# Patient Record
Sex: Female | Born: 1960 | Race: White | Hispanic: No | Smoking: Current every day smoker
Health system: Southern US, Community
[De-identification: ages and names within clinical notes are randomized; demographics above are authoritative.]

## PROBLEM LIST (undated history)

## (undated) DIAGNOSIS — N200 Calculus of kidney: Secondary | ICD-10-CM

## (undated) DIAGNOSIS — K589 Irritable bowel syndrome without diarrhea: Secondary | ICD-10-CM

## (undated) HISTORY — PX: ABDOMINAL HYSTERECTOMY: SHX81

## (undated) HISTORY — PX: CHOLECYSTECTOMY: SHX55

## (undated) HISTORY — PX: EYE SURGERY: SHX253

---

## 2011-05-27 ENCOUNTER — Emergency Department (HOSPITAL_COMMUNITY)
Admission: EM | Admit: 2011-05-27 | Discharge: 2011-05-27 | Disposition: A | Discharge status: Home / Self Care | Payer: Medicaid Other | Attending: Emergency Medicine | Admitting: Emergency Medicine

## 2011-05-27 ENCOUNTER — Encounter: Payer: Self-pay | Admitting: *Deleted

## 2011-05-27 ENCOUNTER — Emergency Department (HOSPITAL_COMMUNITY): Payer: Medicaid Other

## 2011-05-27 DIAGNOSIS — G43909 Migraine, unspecified, not intractable, without status migrainosus: Secondary | ICD-10-CM | POA: Insufficient documentation

## 2011-05-27 DIAGNOSIS — M545 Low back pain, unspecified: Secondary | ICD-10-CM | POA: Insufficient documentation

## 2011-05-27 DIAGNOSIS — F17200 Nicotine dependence, unspecified, uncomplicated: Secondary | ICD-10-CM | POA: Insufficient documentation

## 2011-05-27 MED ORDER — MORPHINE SULFATE 4 MG/ML IJ SOLN
4.0000 mg | Freq: Once | INTRAMUSCULAR | Status: AC
Start: 2011-05-27 — End: 2011-05-27
  Administered 2011-05-27: 4 mg via INTRAVENOUS
  Filled 2011-05-27: qty 1

## 2011-05-27 MED ORDER — ONDANSETRON HCL 4 MG/2ML IJ SOLN
4.0000 mg | Freq: Once | INTRAMUSCULAR | Status: AC
  Administered 2011-05-27: 4 mg via INTRAVENOUS
  Filled 2011-05-27: qty 2

## 2011-05-27 MED ORDER — HYDROCODONE-ACETAMINOPHEN 5-325 MG PO TABS: 1.0000 | ORAL_TABLET | ORAL | Status: AC | PRN

## 2011-05-27 MED ORDER — MORPHINE SULFATE 4 MG/ML IJ SOLN
4.0000 mg | Freq: Once | INTRAMUSCULAR | Status: AC
  Administered 2011-05-27: 4 mg via INTRAVENOUS
  Filled 2011-05-27: qty 1

## 2011-05-27 MED ORDER — DEXAMETHASONE SODIUM PHOSPHATE 4 MG/ML IJ SOLN
10.0000 mg | Freq: Once | INTRAMUSCULAR | Status: AC
  Administered 2011-05-27: 10 mg via INTRAVENOUS
  Filled 2011-05-27: qty 2
  Filled 2011-05-27: qty 1

## 2011-05-27 MED ORDER — CYCLOBENZAPRINE HCL 10 MG PO TABS: 10.0000 mg | ORAL_TABLET | Freq: Two times a day (BID) | ORAL | Status: AC | PRN

## 2011-05-27 NOTE — ED Notes (Signed)
Pt states she fell over her dog x 5 days ago, denies hitting head/loc.  States she felt she pulled muscle in lower back.  C/o lower back pain, stiff neck, weak in legs, migraine x 4 days with n/v, light/sound sensitivity.

## 2011-05-27 NOTE — ED Provider Notes (Addendum)
History     Chief Complaint  Patient presents with  . Back Pain  . Migraine   Patient is a 50 y.o. female presenting with back pain and migraine. The history is provided by the patient.  Back Pain  This is a new problem. The current episode started more than 2 days ago. The problem occurs constantly. The problem has been gradually worsening. The pain is associated with falling. The pain is present in the lumbar spine and gluteal region. The quality of the pain is described as aching and stabbing. The pain is moderate. The symptoms are aggravated by certain positions. Stiffness is present all day. Associated symptoms include headaches. Pertinent negatives include no fever, no dysuria and no weakness.  Migraine This is a recurrent problem. The current episode started in the past 7 days. The problem occurs constantly. The problem has been gradually worsening. Associated symptoms include headaches, nausea and vomiting. Pertinent negatives include no fever or weakness. Exacerbated by: Headache worse with light/sound. Treatments tried: No better with Exedrin Migraine.  Migraine This is a recurrent problem. The current episode started in the past 7 days. The problem occurs constantly. The problem has been gradually worsening. Associated symptoms include headaches. Exacerbated by: Headache worse with light/sound. Treatments tried: No better with Exedrin Migraine.  She tripped, falling over her dog 4 days ago injuring her back. She complains of low back and bilateral hip pain since fall that continues to worsen to where now she feels stiff all over  Past Medical History  Diagnosis Date  . Migraine     Past Surgical History  Procedure Date  . Abdominal hysterectomy   . Cholecystectomy     History reviewed. No pertinent family history.  History  Substance Use Topics  . Smoking status: Current Everyday Smoker  . Smokeless tobacco: Not on file  . Alcohol Use: No    OB History    Grav Para  Term Preterm Abortions TAB SAB Ect Mult Living                  Review of Systems  Constitutional: Negative.  Negative for fever.  Eyes: Positive for photophobia.  Respiratory: Negative.   Cardiovascular: Negative.   Gastrointestinal: Positive for nausea and vomiting.  Genitourinary: Negative.  Negative for dysuria.  Musculoskeletal: Positive for back pain.  Neurological: Positive for headaches. Negative for dizziness, facial asymmetry, speech difficulty and weakness.    Physical Exam  BP 131/87  Pulse 87  Temp(Src) 98.1 F (36.7 C) (Oral)  Resp 18  Ht 5\' 6"  (1.676 m)  Wt 110 lb (49.896 kg)  BMI 17.75 kg/m2  SpO2 100%  Physical Exam  Constitutional: She appears well-developed and well-nourished.  HENT:  Head: Normocephalic and atraumatic.  Eyes: EOM are normal. Pupils are equal, round, and reactive to light.  Neck: Normal range of motion. Neck supple.  Pulmonary/Chest: Effort normal.  Abdominal: Soft. Bowel sounds are normal.  Musculoskeletal:       Right hip: She exhibits no tenderness.       Left hip: She exhibits no tenderness.       Lumbar back: She exhibits tenderness and pain. She exhibits no swelling.    ED Course  Procedures  MDM  Medical screening examination/treatment/procedure(s) were conducted as a shared visit with non-physician practitioner(s) and myself.  I personally evaluated the patient during the encounter.   See my noe above    Chea Malan    Rodena Medin, PA 05/27/11 1303  [name],  MD 05/27/11 1455  [name], MD 06/25/11 [phone]

## 2011-10-27 ENCOUNTER — Encounter (HOSPITAL_COMMUNITY): Payer: Self-pay | Admitting: *Deleted

## 2011-10-27 ENCOUNTER — Emergency Department (HOSPITAL_COMMUNITY)
Admission: EM | Admit: 2011-10-27 | Discharge: 2011-10-27 | Disposition: A | Discharge status: Home / Self Care | Payer: Medicaid Other | Attending: Emergency Medicine | Admitting: Emergency Medicine

## 2011-10-27 DIAGNOSIS — R109 Unspecified abdominal pain: Secondary | ICD-10-CM

## 2011-10-27 DIAGNOSIS — R112 Nausea with vomiting, unspecified: Secondary | ICD-10-CM | POA: Insufficient documentation

## 2011-10-27 DIAGNOSIS — R1013 Epigastric pain: Secondary | ICD-10-CM | POA: Insufficient documentation

## 2011-10-27 DIAGNOSIS — Z9079 Acquired absence of other genital organ(s): Secondary | ICD-10-CM | POA: Insufficient documentation

## 2011-10-27 DIAGNOSIS — F17200 Nicotine dependence, unspecified, uncomplicated: Secondary | ICD-10-CM | POA: Insufficient documentation

## 2011-10-27 DIAGNOSIS — Z87442 Personal history of urinary calculi: Secondary | ICD-10-CM | POA: Insufficient documentation

## 2011-10-27 HISTORY — DX: Calculus of kidney: N20.0

## 2011-10-27 LAB — BASIC METABOLIC PANEL
CO2: 23 mEq/L (ref 19–32)
Calcium: 9.9 mg/dL (ref 8.4–10.5)
Creatinine, Ser: 0.66 mg/dL (ref 0.50–1.10)
GFR calc non Af Amer: >90 mL/min (ref >90)
Glucose, Bld: 94 mg/dL (ref 70–99)
Sodium: 141 mEq/L (ref 135–145)

## 2011-10-27 LAB — CBC
MCH: 29.6 pg (ref 26.0–34.0)
MCV: 90.4 fL (ref 78.0–100.0)
Platelets: 137 10*3/uL — ABNORMAL LOW (ref 150–400)
RBC: 4.77 MIL/uL (ref 3.87–5.11)
RDW: 13.4 % (ref 11.5–15.5)
WBC: 7 10*3/uL (ref 4.0–10.5)

## 2011-10-27 LAB — DIFFERENTIAL
Basophils Absolute: 0 10*3/uL (ref 0.0–0.1)
Eosinophils Absolute: 0 10*3/uL (ref 0.0–0.7)
Eosinophils Relative: 0 % (ref 0–5)
Lymphs Abs: 1.6 10*3/uL (ref 0.7–4.0)
Neutrophils Relative %: 72 % (ref 43–77)

## 2011-10-27 MED ORDER — ONDANSETRON HCL 4 MG/2ML IJ SOLN
4.0000 mg | Freq: Once | INTRAMUSCULAR | Status: AC
  Administered 2011-10-27: 4 mg via INTRAVENOUS
  Filled 2011-10-27: qty 2

## 2011-10-27 MED ORDER — HYDROMORPHONE HCL PF 1 MG/ML IJ SOLN
1.0000 mg | Freq: Once | INTRAMUSCULAR | Status: AC
  Administered 2011-10-27: 1 mg via INTRAVENOUS
  Filled 2011-10-27: qty 1

## 2011-10-27 MED ORDER — OXYCODONE-ACETAMINOPHEN 5-325 MG PO TABS: 1.0000 | ORAL_TABLET | Freq: Four times a day (QID) | ORAL | Status: AC | PRN

## 2011-10-27 MED ORDER — HYDROMORPHONE HCL PF 1 MG/ML IJ SOLN
0.5000 mg | Freq: Once | INTRAMUSCULAR | Status: AC
  Administered 2011-10-27: 0.5 mg via INTRAVENOUS
  Filled 2011-10-27: qty 1

## 2011-10-27 MED ORDER — PANTOPRAZOLE SODIUM 40 MG IV SOLR
40.0000 mg | Freq: Once | INTRAVENOUS | Status: AC
  Administered 2011-10-27: 40 mg via INTRAVENOUS
  Filled 2011-10-27: qty 40

## 2011-10-27 NOTE — ED Provider Notes (Signed)
This chart was scribed for Joya Gaskins, MD by Wallis Mart. The patient was seen in room [facility]2/[facility] and the patient's care was started at 2:25 PM.   CSN: [id number] Arrival date & time: 10/27/2011  1:23 PM   First MD Initiated Contact with Patient 10/27/11 1346      Chief Complaint  Patient presents with  . Abdominal Pain  . Emesis     Carolyn Boyd is a 50 y.o. female who presents to the Emergency Department complaining of   progressively worsening, sudden onset abdominal pain in the epigastriac region that began several days ago. Pt states that the pain is constant but worsens when she eats.  Pt c/o associated emesis. Pt has normal BMs and has noticed some blood/dark spots in her stool.   Nothing improves the pain. Pt has h/o cholecystectomy and hysterectomy.   No blood in vomitus Pt reports she has had this pain on/off in the past and has GI referral later this month for workup No CP reported  Patient is a 50 y.o. female presenting with abdominal pain and vomiting. The history is provided by the patient.  Abdominal Pain The primary symptoms of the illness include abdominal pain and vomiting. The current episode started more than 2 days ago. The onset of the illness was sudden. The problem has been gradually worsening.  The abdominal pain began more than 2 days ago. The pain came on suddenly. The abdominal pain has been gradually worsening since its onset. The abdominal pain is located in the epigastric region. The abdominal pain is relieved by nothing. The abdominal pain is exacerbated by eating.  Emesis  Associated symptoms include abdominal pain.     Past Medical History  Diagnosis Date  . Migraine   . Kidney stones     Past Surgical History  Procedure Date  . Abdominal hysterectomy   . Cholecystectomy     History reviewed. No pertinent family history.  History  Substance Use Topics  . Smoking status: Current Everyday Smoker -- 0.5 packs/day    Types:  Cigarettes  . Smokeless tobacco: Not on file  . Alcohol Use: No    OB History    Grav Para Term Preterm Abortions TAB SAB Ect Mult Living                  Review of Systems  Gastrointestinal: Positive for vomiting and abdominal pain.  10 Systems reviewed and are negative for acute change except as noted in the HPI.   Allergies  Penicillins and Sulfur  Home Medications   Current Outpatient Rx  Name Route Sig Dispense Refill  . DICYCLOMINE HCL 10 MG PO CAPS Oral Take 10 mg by mouth 4 (four) times daily -  before meals and at bedtime.      Marland Kitchen ESOMEPRAZOLE MAGNESIUM 40 MG PO CPDR Oral Take 40 mg by mouth daily before breakfast.        BP 132/91  Pulse 87  Temp(Src) 98.1 F (36.7 C) (Oral)  Resp 22  Ht 5' 6.5" (1.689 m)  Wt 106 lb (48.081 kg)  BMI 16.85 kg/m2  SpO2 99%  Physical Exam CONSTITUTIONAL: Well developed/well nourished HEAD AND FACE: Normocephalic/atraumatic EYES: EOMI/PERRL, no scleral icterus ENMT: Mucous membranes moist NECK: supple no meningeal signs CV: S1/S2 noted, no murmurs/rubs/gallops noted LUNGS: Lungs are clear to auscultation bilaterally, no apparent distress ABDOMEN:  soft but diffuse tenderness, no rebound or guarding GU - no CVA tenderness NEURO: Pt is awake/alert, moves all  extremitiesx4 EXTREMITIES: pulses normal, full ROM SKIN: warm, color normal PSYCH: no abnormalities of mood noted  ED Course  Procedures  DIAGNOSTIC STUDIES: Oxygen Saturation is 99% on room air, normal by my interpretation.    COORDINATION OF CARE:    Labs Reviewed  CBC - Abnormal; Notable for the following:    Platelets 137 (*)    All other components within normal limits  DIFFERENTIAL  BASIC METABOLIC PANEL   7:84 PM Labs reviewed, pt improved, no vomiting, well appearing, reports she has had this pain on/off for awhile, doubt acute abd process  The patient appears reasonably screened and/or stabilized for discharge and I doubt any other medical  condition or other [facility] requiring further screening, evaluation, or treatment in the ED at this time prior to discharge.    MDM  Nursing notes reviewed and considered in documentation All labs/vitals reviewed and considered    I personally performed the services described in this documentation, which was scribed in my presence. The recorded information has been reviewed and considered.          Joya Gaskins, MD 10/27/11 [phone]

## 2011-10-27 NOTE — ED Notes (Signed)
Pt c/o abd pain and vomiting. Pt states she was scheduled to see a gastroenterologist on the 20th of this month but could not stand the pain.

## 2012-01-31 ENCOUNTER — Encounter (HOSPITAL_COMMUNITY): Payer: Self-pay | Admitting: Oncology

## 2012-01-31 ENCOUNTER — Emergency Department (HOSPITAL_COMMUNITY)
Admission: EM | Admit: 2012-01-31 | Discharge: 2012-01-31 | Disposition: A | Discharge status: Home / Self Care | Payer: Medicaid Other | Attending: Emergency Medicine | Admitting: Emergency Medicine

## 2012-01-31 DIAGNOSIS — K529 Noninfective gastroenteritis and colitis, unspecified: Secondary | ICD-10-CM

## 2012-01-31 DIAGNOSIS — K5289 Other specified noninfective gastroenteritis and colitis: Secondary | ICD-10-CM | POA: Insufficient documentation

## 2012-01-31 DIAGNOSIS — Z9089 Acquired absence of other organs: Secondary | ICD-10-CM | POA: Insufficient documentation

## 2012-01-31 DIAGNOSIS — Z90710 Acquired absence of both cervix and uterus: Secondary | ICD-10-CM | POA: Insufficient documentation

## 2012-01-31 DIAGNOSIS — F411 Generalized anxiety disorder: Secondary | ICD-10-CM | POA: Insufficient documentation

## 2012-01-31 DIAGNOSIS — R109 Unspecified abdominal pain: Secondary | ICD-10-CM | POA: Insufficient documentation

## 2012-01-31 DIAGNOSIS — R10819 Abdominal tenderness, unspecified site: Secondary | ICD-10-CM | POA: Insufficient documentation

## 2012-01-31 HISTORY — DX: Irritable bowel syndrome, unspecified: K58.9

## 2012-01-31 LAB — DIFFERENTIAL
Basophils Absolute: 0 10*3/uL (ref 0.0–0.1)
Lymphocytes Relative: 23 % (ref 12–46)
Lymphs Abs: 1.4 10*3/uL (ref 0.7–4.0)
Neutro Abs: 4.5 10*3/uL (ref 1.7–7.7)
Neutrophils Relative %: 73 % (ref 43–77)

## 2012-01-31 LAB — URINALYSIS, ROUTINE W REFLEX MICROSCOPIC
Glucose, UA: NEGATIVE mg/dL
Leukocytes, UA: NEGATIVE
Protein, ur: NEGATIVE mg/dL
Specific Gravity, Urine: 1.015 (ref 1.005–1.030)

## 2012-01-31 LAB — BASIC METABOLIC PANEL
CO2: 25 mEq/L (ref 19–32)
Calcium: 9.8 mg/dL (ref 8.4–10.5)
Chloride: 108 mEq/L (ref 96–112)
Glucose, Bld: 107 mg/dL — ABNORMAL HIGH (ref 70–99)
Potassium: 4.1 mEq/L (ref 3.5–5.1)
Sodium: 142 mEq/L (ref 135–145)

## 2012-01-31 LAB — URINE MICROSCOPIC-ADD ON

## 2012-01-31 LAB — POCT PREGNANCY, URINE: Preg Test, Ur: NEGATIVE

## 2012-01-31 LAB — CBC
Platelets: 143 10*3/uL — ABNORMAL LOW (ref 150–400)
RBC: 4.56 MIL/uL (ref 3.87–5.11)
RDW: 13.7 % (ref 11.5–15.5)
WBC: 6.2 10*3/uL (ref 4.0–10.5)

## 2012-01-31 MED ORDER — SODIUM CHLORIDE 0.9 % IV SOLN
Freq: Once | INTRAVENOUS | Status: AC
  Administered 2012-01-31: 1000 mL via INTRAVENOUS

## 2012-01-31 MED ORDER — LOPERAMIDE HCL 2 MG PO CAPS
4.0000 mg | ORAL_CAPSULE | Freq: Once | ORAL | Status: AC
  Administered 2012-01-31: 4 mg via ORAL
  Filled 2012-01-31: qty 2

## 2012-01-31 MED ORDER — HYDROMORPHONE HCL PF 1 MG/ML IJ SOLN
1.0000 mg | Freq: Once | INTRAMUSCULAR | Status: AC
  Administered 2012-01-31: 1 mg via INTRAVENOUS
  Filled 2012-01-31: qty 1

## 2012-01-31 MED ORDER — FENTANYL CITRATE 0.05 MG/ML IJ SOLN
50.0000 ug | INTRAMUSCULAR | Status: AC
  Administered 2012-01-31: 50 ug via INTRAVENOUS
  Filled 2012-01-31: qty 2

## 2012-01-31 MED ORDER — ONDANSETRON HCL 4 MG/2ML IJ SOLN
4.0000 mg | Freq: Once | INTRAMUSCULAR | Status: AC
  Administered 2012-01-31: 4 mg via INTRAVENOUS
  Filled 2012-01-31: qty 2

## 2012-01-31 MED ORDER — METOCLOPRAMIDE HCL 10 MG PO TABS: 10.0000 mg | ORAL_TABLET | Freq: Four times a day (QID) | ORAL | Status: DC | PRN

## 2012-01-31 MED ORDER — SODIUM CHLORIDE 0.9 % IV BOLUS (SEPSIS)
1000.0000 mL | Freq: Once | INTRAVENOUS | Status: AC
  Administered 2012-01-31: 1000 mL via INTRAVENOUS

## 2012-01-31 NOTE — Discharge Instructions (Signed)
Take Imodium AD as needed for diarrhea.  Diarrhea Infections caused by germs (bacterial) or a virus commonly cause diarrhea. Your caregiver has determined that with time, rest and fluids, the diarrhea should improve. In general, eat normally while drinking more water than usual. Although water may prevent dehydration, it does not contain salt and minerals (electrolytes). Broths, weak tea without caffeine and oral rehydration solutions (ORS) replace fluids and electrolytes. Small amounts of fluids should be taken frequently. Large amounts at one time may not be tolerated. Plain water may be harmful in infants and the elderly. Oral rehydrating solutions (ORS) are available at pharmacies and grocery stores. ORS replace water and important electrolytes in proper proportions. Sports drinks are not as effective as ORS and may be harmful due to sugars worsening diarrhea.  ORS is especially recommended for use in children with diarrhea. As a general guideline for children, replace any new fluid losses from diarrhea and/or vomiting with ORS as follows:   If your child weighs 22 pounds or under (10 kg or less), give 60-120 mL ( -  cup or 2 - 4 ounces) of ORS for each episode of diarrheal stool or vomiting episode.   If your child weighs more than 22 pounds (more than 10 kgs), give 120-240 mL ( - 1 cup or 4 - 8 ounces) of ORS for each diarrheal stool or episode of vomiting.   While correcting for dehydration, children should eat normally. However, foods high in sugar should be avoided because this may worsen diarrhea. Large amounts of carbonated soft drinks, juice, gelatin desserts and other highly sugared drinks should be avoided.   After correction of dehydration, other liquids that are appealing to the child may be added. Children should drink small amounts of fluids frequently and fluids should be increased as tolerated. Children should drink enough fluids to keep urine clear or pale yellow.   Adults  should eat normally while drinking more fluids than usual. Drink small amounts of fluids frequently and increase as tolerated. Drink enough fluids to keep urine clear or pale yellow. Broths, weak decaffeinated tea, lemon lime soft drinks (allowed to go flat) and ORS replace fluids and electrolytes.   Avoid:   Carbonated drinks.   Juice.   Extremely hot or cold fluids.   Caffeine drinks.   Fatty, greasy foods.   Alcohol.   Tobacco.   Too much intake of anything at one time.   Gelatin desserts.   Probiotics are active cultures of beneficial bacteria. They may lessen the amount and number of diarrheal stools in adults. Probiotics can be found in yogurt with active cultures and in supplements.   Wash hands well to avoid spreading bacteria and virus.   Anti-diarrheal medications are not recommended for infants and children.   Only take over-the-counter or prescription medicines for pain, discomfort or fever as directed by your caregiver. Do not give aspirin to children because it may cause Reye's Syndrome.   For adults, ask your caregiver if you should continue all prescribed and over-the-counter medicines.   If your caregiver has given you a follow-up appointment, it is very important to keep that appointment. Not keeping the appointment could result in a chronic or permanent injury, and disability. If there is any problem keeping the appointment, you must call back to this facility for assistance.  SEEK IMMEDIATE MEDICAL CARE IF:   You or your child is unable to keep fluids down or other symptoms or problems become worse in spite of treatment.  Vomiting or diarrhea develops and becomes persistent.   There is vomiting of blood or bile (green material).   There is blood in the stool or the stools are black and tarry.   There is no urine output in 6-8 hours or there is only a small amount of very dark urine.   Abdominal pain develops, increases or localizes.   You have a  fever.   Your baby is older than 3 months with a rectal temperature of 102 F (38.9 C) or higher.   Your baby is 56 months old or younger with a rectal temperature of 100.4 F (38 C) or higher.   You or your child develops excessive weakness, dizziness, fainting or extreme thirst.   You or your child develops a rash, stiff neck, severe headache or become irritable or sleepy and difficult to awaken.  MAKE SURE YOU:   Understand these instructions.   Will watch your condition.   Will get help right away if you are not doing well or get worse.  Document Released: 10/22/2002 Document Revised: 10/21/2011 Document Reviewed: 09/08/2009 [facility] Patient Information 2012 [place], [place].  Nausea and Vomiting Nausea is a sick feeling that often comes before throwing up (vomiting). Vomiting is a reflex where stomach contents come out of your mouth. Vomiting can cause severe loss of body fluids (dehydration). Children and elderly adults can become dehydrated quickly, especially if they also have diarrhea. Nausea and vomiting are symptoms of a condition or disease. It is important to find the cause of your symptoms. CAUSES   Direct irritation of the stomach lining. This irritation can result from increased acid production (gastroesophageal reflux disease), infection, food poisoning, taking certain medicines (such as nonsteroidal anti-inflammatory drugs), alcohol use, or tobacco use.   Signals from the brain.These signals could be caused by a headache, heat exposure, an inner ear disturbance, increased pressure in the brain from injury, infection, a tumor, or a concussion, pain, emotional stimulus, or metabolic problems.   An obstruction in the gastrointestinal tract (bowel obstruction).   Illnesses such as diabetes, hepatitis, gallbladder problems, appendicitis, kidney problems, cancer, sepsis, atypical symptoms of a heart attack, or eating disorders.   Medical treatments such as chemotherapy and  radiation.   Receiving medicine that makes you sleep (general anesthetic) during surgery.  DIAGNOSIS Your caregiver may ask for tests to be done if the problems do not improve after a few days. Tests may also be done if symptoms are severe or if the reason for the nausea and vomiting is not clear. Tests may include:  Urine tests.   Blood tests.   Stool tests.   Cultures (to look for evidence of infection).   X-rays or other imaging studies.  Test results can help your caregiver make decisions about treatment or the need for additional tests. TREATMENT You need to stay well hydrated. Drink frequently but in small amounts.You may wish to drink water, sports drinks, clear broth, or eat frozen ice pops or gelatin dessert to help stay hydrated.When you eat, eating slowly may help prevent nausea.There are also some antinausea medicines that may help prevent nausea. HOME CARE INSTRUCTIONS   Take all medicine as directed by your caregiver.   If you do not have an appetite, do not force yourself to eat. However, you must continue to drink fluids.   If you have an appetite, eat a normal diet unless your caregiver tells you differently.   Eat a variety of complex carbohydrates (rice, wheat, potatoes, bread), lean meats,  yogurt, fruits, and vegetables.   Avoid high-fat foods because they are more difficult to digest.   Drink enough water and fluids to keep your urine clear or pale yellow.   If you are dehydrated, ask your caregiver for specific rehydration instructions. Signs of dehydration may include:   Severe thirst.   Dry lips and mouth.   Dizziness.   Dark urine.   Decreasing urine frequency and amount.   Confusion.   Rapid breathing or pulse.  SEEK IMMEDIATE MEDICAL CARE IF:   You have blood or brown flecks (like coffee grounds) in your vomit.   You have black or bloody stools.   You have a severe headache or stiff neck.   You are confused.   You have severe  abdominal pain.   You have chest pain or trouble breathing.   You do not urinate at least once every 8 hours.   You develop cold or clammy skin.   You continue to vomit for longer than 24 to 48 hours.   You have a fever.  MAKE SURE YOU:   Understand these instructions.   Will watch your condition.   Will get help right away if you are not doing well or get worse.  Document Released: 11/01/2005 Document Revised: 10/21/2011 Document Reviewed: 03/31/2011 [facility] Patient Information 2012 [place], [place].  Metoclopramide tablets What is this medicine? METOCLOPRAMIDE (met oh kloe PRA mide) is used to treat the symptoms of gastroesophageal reflux disease (GERD) like heartburn. It is also used to treat people with slow emptying of the stomach and intestinal tract. This medicine may be used for other purposes; ask your health care provider or pharmacist if you have questions. What should I tell my health care provider before I take this medicine? They need to know if you have any of these conditions: -breast cancer -depression -diabetes -heart failure -high blood pressure -kidney disease -liver disease -Parkinson's disease or a movement disorder -pheochromocytoma -seizures -stomach obstruction, bleeding, or perforation -an unusual or allergic reaction to metoclopramide, procainamide, sulfites, other medicines, foods, dyes, or preservatives -pregnant or trying to get pregnant -breast-feeding How should I use this medicine? Take this medicine by mouth with a glass of water. Follow the directions on the prescription label. Take this medicine on an empty stomach, about 30 minutes before eating. Take your doses at regular intervals. Do not take your medicine more often than directed. Do not stop taking except on the advice of your doctor or health care professional. A special MedGuide will be given to you by the pharmacist with each prescription and refill. Be sure to read this  information carefully each time. Talk to your pediatrician regarding the use of this medicine in children. Special care may be needed. Overdosage: If you think you have taken too much of this medicine contact a poison control center or emergency room at once. NOTE: This medicine is only for you. Do not share this medicine with others. What if I miss a dose? If you miss a dose, take it as soon as you can. If it is almost time for your next dose, take only that dose. Do not take double or extra doses. What may interact with this medicine? -acetaminophen -cyclosporine -digoxin -medicines for blood pressure -medicines for diabetes, including insulin -medicines for hay fever and other allergies -medicines for depression, especially an Monoamine Oxidase Inhibitor (MAOI) -medicines for Parkinson's disease, like levodopa -medicines for sleep or for pain -tetracycline This list may not describe all possible interactions. Give your  health care provider a list of all the medicines, herbs, non-prescription drugs, or dietary supplements you use. Also tell them if you smoke, drink alcohol, or use illegal drugs. Some items may interact with your medicine. What should I watch for while using this medicine? It may take a few weeks for your stomach condition to start to get better. However, do not take this medicine for longer than 12 weeks. The longer you take this medicine, and the more you take it, the greater your chances are of developing serious side effects. If you are an elderly patient, a female patient, or you have diabetes, you may be at an increased risk for side effects from this medicine. Contact your doctor immediately if you start having movements you cannot control such as lip smacking, rapid movements of the tongue, involuntary or uncontrollable movements of the eyes, head, arms and legs, or muscle twitches and spasms. Patients and their families should watch out for worsening depression or  thoughts of suicide. Also watch out for any sudden or severe changes in feelings such as feeling anxious, agitated, panicky, irritable, hostile, aggressive, impulsive, severely restless, overly excited and hyperactive, or not being able to sleep. If this happens, especially at the beginning of treatment or after a change in dose, call your doctor. Do not treat yourself for high fever. Ask your doctor or health care professional for advice. You may get drowsy or dizzy. Do not drive, use machinery, or do anything that needs mental alertness until you know how this drug affects you. Do not stand or sit up quickly, especially if you are an older patient. This reduces the risk of dizzy or fainting spells. Alcohol can make you more drowsy and dizzy. Avoid alcoholic drinks. What side effects may I notice from receiving this medicine? Side effects that you should report to your doctor or health care professional as soon as possible: -allergic reactions like skin rash, itching or hives, swelling of the face, lips, or tongue -abnormal production of milk in females -breast enlargement in both males and females -change in the way you walk -difficulty moving, speaking or swallowing -drooling, lip smacking, or rapid movements of the tongue -excessive sweating -fever -involuntary or uncontrollable movements of the eyes, head, arms and legs -irregular heartbeat or palpitations -muscle twitches and spasms -unusually weak or tired Side effects that usually do not require medical attention (report to your doctor or health care professional if they continue or are bothersome): -change in sex drive or performance -depressed mood -diarrhea -difficulty sleeping -headache -menstrual changes -restless or nervous This list may not describe all possible side effects. Call your doctor for medical advice about side effects. You may report side effects to FDA at 1-800-FDA-1088. Where should I keep my medicine? Keep out  of the reach of children. Store at room temperature between 20 and 25 degrees C (68 and 77 degrees F). Protect from light. Keep container tightly closed. Throw away any unused medicine after the expiration date. NOTE: This sheet is a summary. It may not cover all possible information. If you have questions about this medicine, talk to your doctor, pharmacist, or health care provider.  2012, [employer]. (06/26/2008 4:30:05 PM)

## 2012-01-31 NOTE — ED Provider Notes (Signed)
History   This chart was scribed for Dione Booze, MD by Clarita Crane. The patient was seen in room APA03/APA03. Patient's care was started at 1001.    CSN: [id number]  Arrival date & time 01/31/12  1001   First MD Initiated Contact with Patient 01/31/12 1055      Chief Complaint  Patient presents with  . Abdominal Pain  . Emesis  . Diarrhea    (Consider location/radiation/quality/duration/timing/severity/associated sxs/prior treatment) HPI Carolyn Boyd is a 51 y.o. female who presents to the Emergency Department complaining of constant moderate to severe abdominal pain described as cramping with associated night sweats, chills, nausea, vomiting and diarrhea onset 3 days ago and worsening since. Patient notes nausea and vomiting preceded onset of abdominal pain. Rates pain an 8 out of 10 currently and an 8 out of 10 at its worst. States pain is aggravated and relieved by nothing.  Patient reports having recent sick contact with son and colleagues. Denies fever, SOB, chest pain. Patient with a h/o migraine, kidney stones, IBS, abdominal hysterectomy, cholecystectomy and is a current smoker (1/2 pack per day).   Past Medical History  Diagnosis Date  . Migraine   . Kidney stones   . IBS (irritable bowel syndrome)     Past Surgical History  Procedure Date  . Abdominal hysterectomy   . Cholecystectomy   . Eye surgery     No family history on file.  History  Substance Use Topics  . Smoking status: Current Everyday Smoker -- 0.5 packs/day    Types: Cigarettes  . Smokeless tobacco: Not on file  . Alcohol Use: No    OB History    Grav Para Term Preterm Abortions TAB SAB Ect Mult Living                  Review of Systems  Constitutional: Positive for chills. Negative for fever and diaphoresis.       Night sweats.   Gastrointestinal: Positive for nausea, vomiting, abdominal pain and diarrhea.    Allergies  Penicillins and Sulfur  Home Medications   Current  Outpatient Rx  Name Route Sig Dispense Refill  . ADULT MULTIVITAMIN W/MINERALS CH Oral Take 1 tablet by mouth daily.      BP 96/54  Pulse 63  Temp(Src) 98.2 F (36.8 C) (Oral)  Resp 16  Ht 5\' 6"  (1.676 m)  Wt 110 lb (49.896 kg)  BMI 17.75 kg/m2  SpO2 97%  Physical Exam  Nursing note and vitals reviewed. Constitutional: She is oriented to person, place, and time. She appears well-developed and well-nourished.       Anxious appearing.   HENT:  Head: Normocephalic and atraumatic.  Eyes: EOM are normal. Pupils are equal, round, and reactive to light.  Neck: Neck supple. No tracheal deviation present.  Cardiovascular: Normal rate and regular rhythm.  Exam reveals no gallop and no friction rub.   No murmur heard. Pulmonary/Chest: Effort normal. No respiratory distress. She has no wheezes. She has no rales.  Abdominal: Soft. She exhibits no distension. There is tenderness (mild, upper abdomen). There is no rebound and no guarding.       Bowel sounds diminished.   Musculoskeletal: Normal range of motion. She exhibits no edema.  Neurological: She is alert and oriented to person, place, and time. No sensory deficit.  Skin: Skin is warm and dry.  Psychiatric: Her behavior is normal.    ED Course  Procedures (including critical care time)  DIAGNOSTIC STUDIES: Oxygen Saturation  is 100% on room air, normal by my interpretation.    COORDINATION OF CARE: 11:02AM- Patient informed of current plan for treatment and evaluation and agrees with plan at this time.  1:59PM- Patient informed of lab results. States abdominal pain has improved but nausea and diarrhea still persist. Informed of at home treatment plan. Will d/c home.    Results for orders placed during the hospital encounter of 01/31/12  CBC      Component Value Range   WBC 6.2  4.0 - 10.5 (K/uL)   RBC 4.56  3.87 - 5.11 (MIL/uL)   Hemoglobin 13.9  12.0 - 15.0 (g/dL)   HCT 91.4  78.2 - 95.6 (%)   MCV 90.1  78.0 - 100.0 (fL)    MCH 30.5  26.0 - 34.0 (pg)   MCHC 33.8  30.0 - 36.0 (g/dL)   RDW 21.3  08.6 - 57.8 (%)   Platelets 143 (*) 150 - 400 (K/uL)  DIFFERENTIAL      Component Value Range   Neutrophils Relative 73  43 - 77 (%)   Neutro Abs 4.5  1.7 - 7.7 (K/uL)   Lymphocytes Relative 23  12 - 46 (%)   Lymphs Abs 1.4  0.7 - 4.0 (K/uL)   Monocytes Relative 4  3 - 12 (%)   Monocytes Absolute 0.2  0.1 - 1.0 (K/uL)   Eosinophils Relative 0  0 - 5 (%)   Eosinophils Absolute 0.0  0.0 - 0.7 (K/uL)   Basophils Relative 0  0 - 1 (%)   Basophils Absolute 0.0  0.0 - 0.1 (K/uL)  BASIC METABOLIC PANEL      Component Value Range   Sodium 142  135 - 145 (mEq/L)   Potassium 4.1  3.5 - 5.1 (mEq/L)   Chloride 108  96 - 112 (mEq/L)   CO2 25  19 - 32 (mEq/L)   Glucose, Bld 107 (*) 70 - 99 (mg/dL)   BUN 8  6 - 23 (mg/dL)   Creatinine, Ser 4.69  0.50 - 1.10 (mg/dL)   Calcium 9.8  8.4 - 62.9 (mg/dL)   GFR calc non Af Amer >90  >90 (mL/min)   GFR calc Af Amer >90  >90 (mL/min)  URINALYSIS, ROUTINE W REFLEX MICROSCOPIC      Component Value Range   Color, Urine YELLOW  YELLOW    APPearance CLEAR  CLEAR    Specific Gravity, Urine 1.015  1.005 - 1.030    pH 7.5  5.0 - 8.0    Glucose, UA NEGATIVE  NEGATIVE (mg/dL)   Hgb urine dipstick LARGE (*) NEGATIVE    Bilirubin Urine NEGATIVE  NEGATIVE    Ketones, ur NEGATIVE  NEGATIVE (mg/dL)   Protein, ur NEGATIVE  NEGATIVE (mg/dL)   Urobilinogen, UA 0.2  0.0 - 1.0 (mg/dL)   Nitrite NEGATIVE  NEGATIVE    Leukocytes, UA NEGATIVE  NEGATIVE   POCT PREGNANCY, URINE      Component Value Range   Preg Test, Ur NEGATIVE  NEGATIVE   URINE MICROSCOPIC-ADD ON      Component Value Range   Squamous Epithelial / LPF RARE  RARE    WBC, UA 3-6  <3 (WBC/hpf)   RBC / HPF TOO NUMEROUS TO COUNT  <3 (RBC/hpf)    1. Gastroenteritis       MDM  Vomiting and diarrhea which most likely represents a viral gastroenteritis. She'll be given IV fluids, IV antiemetics and oral antimotility agents and  reevaluated.  I personally performed the services described in this documentation, which was scribed in my presence. The recorded information has been reviewed and considered.      Dione Booze, MD 01/31/12 [phone]

## 2012-01-31 NOTE — ED Notes (Signed)
Carolyn Boyd reports 3 days of abdominal pain w/ n/v/d.  Denies fevers.  Pt states she has not taken meds for her symptoms.

## 2012-05-04 ENCOUNTER — Emergency Department (HOSPITAL_COMMUNITY)
Admission: EM | Admit: 2012-05-04 | Discharge: 2012-05-04 | Disposition: A | Discharge status: Home / Self Care | Payer: Self-pay | Attending: Emergency Medicine | Admitting: Emergency Medicine

## 2012-05-04 ENCOUNTER — Emergency Department (HOSPITAL_COMMUNITY): Payer: Self-pay

## 2012-05-04 ENCOUNTER — Encounter (HOSPITAL_COMMUNITY): Payer: Self-pay

## 2012-05-04 DIAGNOSIS — K589 Irritable bowel syndrome without diarrhea: Secondary | ICD-10-CM | POA: Insufficient documentation

## 2012-05-04 DIAGNOSIS — F17200 Nicotine dependence, unspecified, uncomplicated: Secondary | ICD-10-CM | POA: Insufficient documentation

## 2012-05-04 DIAGNOSIS — Z880 Allergy status to penicillin: Secondary | ICD-10-CM | POA: Insufficient documentation

## 2012-05-04 DIAGNOSIS — N200 Calculus of kidney: Secondary | ICD-10-CM | POA: Insufficient documentation

## 2012-05-04 LAB — DIFFERENTIAL
Basophils Absolute: 0 10*3/uL (ref 0.0–0.1)
Basophils Relative: 0 % (ref 0–1)
Eosinophils Absolute: 0 10*3/uL (ref 0.0–0.7)
Monocytes Absolute: 0.3 10*3/uL (ref 0.1–1.0)
Monocytes Relative: 4 % (ref 3–12)
Neutro Abs: 4 10*3/uL (ref 1.7–7.7)
Neutrophils Relative %: 67 % (ref 43–77)

## 2012-05-04 LAB — BASIC METABOLIC PANEL
BUN: 6 mg/dL (ref 6–23)
Chloride: 105 mEq/L (ref 96–112)
Creatinine, Ser: 0.64 mg/dL (ref 0.50–1.10)
GFR calc Af Amer: >90 mL/min (ref >90)
GFR calc non Af Amer: >90 mL/min (ref >90)
Potassium: 3.7 mEq/L (ref 3.5–5.1)

## 2012-05-04 LAB — CBC
Hemoglobin: 14.6 g/dL (ref 12.0–15.0)
MCH: 30.2 pg (ref 26.0–34.0)
MCHC: 33.2 g/dL (ref 30.0–36.0)
RDW: 13.4 % (ref 11.5–15.5)

## 2012-05-04 LAB — URINALYSIS, ROUTINE W REFLEX MICROSCOPIC
Bilirubin Urine: NEGATIVE
Ketones, ur: NEGATIVE mg/dL
Leukocytes, UA: NEGATIVE
Nitrite: NEGATIVE
Urobilinogen, UA: 0.2 mg/dL (ref 0.0–1.0)

## 2012-05-04 MED ORDER — HYDROMORPHONE HCL PF 1 MG/ML IJ SOLN
1.0000 mg | Freq: Once | INTRAMUSCULAR | Status: AC
  Administered 2012-05-04: 1 mg via INTRAVENOUS
  Filled 2012-05-04: qty 1

## 2012-05-04 MED ORDER — ONDANSETRON HCL 4 MG/2ML IJ SOLN
4.0000 mg | Freq: Once | INTRAMUSCULAR | Status: AC
  Administered 2012-05-04: 4 mg via INTRAVENOUS
  Filled 2012-05-04: qty 2

## 2012-05-04 MED ORDER — SODIUM CHLORIDE 0.9 % IV BOLUS (SEPSIS)
1000.0000 mL | Freq: Once | INTRAVENOUS | Status: AC
  Administered 2012-05-04: 1000 mL via INTRAVENOUS

## 2012-05-04 MED ORDER — ONDANSETRON 4 MG PO TBDP: 4.0000 mg | ORAL_TABLET | Freq: Three times a day (TID) | ORAL | Status: AC | PRN

## 2012-05-04 MED ORDER — OXYCODONE-ACETAMINOPHEN 5-325 MG PO TABS: ORAL_TABLET | ORAL | Status: DC

## 2012-05-04 NOTE — ED Notes (Signed)
Patient requesting pain meds. Al Decant, PA aware and orders obtained.

## 2012-05-04 NOTE — ED Provider Notes (Signed)
History     CSN: [id number]  Arrival date & time 05/04/12  1022   First MD Initiated Contact with Patient 05/04/12 1043      Chief Complaint  Patient presents with  . Abdominal Pain    (Consider location/radiation/quality/duration/timing/severity/associated sxs/prior treatment) HPI Comments: "i thought it was my IBS acting up" over the past couple days.  Intense pain since 0200 today with vomiting x 6 and now bilious.  Urine out put is decreased and apparent hematuria.  Saw GI MD in [place] less than 1 month ago.  Had CT done and incidental finding was reportedly a small  R renal calculus.  Not sure if she has had an appendectomy.  Patient is a 51 y.o. female presenting with abdominal pain. The history is provided by the patient. No language interpreter was used.  Abdominal Pain The primary symptoms of the illness include abdominal pain, nausea and vomiting. The primary symptoms of the illness do not include fever, diarrhea, hematemesis, hematochezia, dysuria or vaginal discharge. Episode onset: awakened ~ 0200 today with RLQ/R flank pain.   The onset of the illness was sudden. The problem has not changed since onset. The patient states that she believes she is currently not pregnant. The patient has not had a change in bowel habit. Additional symptoms associated with the illness include hematuria and back pain. Symptoms associated with the illness do not include chills, diaphoresis, constipation, urgency or frequency. Significant associated medical issues include inflammatory bowel disease. Significant associated medical issues do not include diverticulitis.    Past Medical History  Diagnosis Date  . Migraine   . Kidney stones   . IBS (irritable bowel syndrome)   . Kidney stones     Past Surgical History  Procedure Date  . Abdominal hysterectomy   . Cholecystectomy   . Eye surgery     No family history on file.  History  Substance Use Topics  . Smoking status: Current Everyday  Smoker -- 0.5 packs/day    Types: Cigarettes  . Smokeless tobacco: Not on file  . Alcohol Use: No    OB History    Grav Para Term Preterm Abortions TAB SAB Ect Mult Living                  Review of Systems  Constitutional: Negative for fever, chills and diaphoresis.  Gastrointestinal: Positive for nausea, vomiting and abdominal pain. Negative for diarrhea, constipation, blood in stool, hematochezia, abdominal distention, rectal pain and hematemesis.  Genitourinary: Positive for hematuria. Negative for dysuria, urgency, frequency, vaginal discharge and pelvic pain.  Musculoskeletal: Positive for back pain.  All other systems reviewed and are negative.    Allergies  Penicillins and Sulfur  Home Medications   Current Outpatient Rx  Name Route Sig Dispense Refill  . IBUPROFEN 200 MG PO TABS Oral Take 800 mg by mouth every 6 (six) hours as needed. pain    . ADULT MULTIVITAMIN W/MINERALS CH Oral Take 1 tablet by mouth daily.    Marland Kitchen ONDANSETRON 4 MG PO TBDP Oral Take 1 tablet (4 mg total) by mouth every 8 (eight) hours as needed for nausea. 10 tablet 0  . OXYCODONE-ACETAMINOPHEN 5-325 MG PO TABS  One tab po q 4-6 hrs prn pain 15 tablet 0    BP 151/91  Pulse 98  Temp 98.1 F (36.7 C) (Oral)  Resp 22  Ht 5\' 6"  (1.676 m)  Wt 110 lb (49.896 kg)  BMI 17.75 kg/m2  SpO2 100%  Physical Exam  Nursing note and vitals reviewed. Constitutional: She is oriented to person, place, and time. She appears well-developed and well-nourished. No distress.  HENT:  Head: Normocephalic and atraumatic.  Eyes: EOM are normal.  Neck: Normal range of motion.  Cardiovascular: Normal rate, regular rhythm and normal heart sounds.   Pulmonary/Chest: Effort normal and breath sounds normal.  Abdominal: Soft. Bowel sounds are normal. She exhibits no distension and no mass. There is tenderness. There is guarding and tenderness at McBurney's point. There is no rigidity, no rebound and no CVA tenderness.     Musculoskeletal: Normal range of motion.       Back:  Neurological: She is alert and oriented to person, place, and time.  Skin: Skin is warm and dry.  Psychiatric: She has a normal mood and affect. Judgment normal.    ED Course  Procedures (including critical care time)  Labs Reviewed  CBC - Abnormal; Notable for the following:    Platelets 132 (*)     All other components within normal limits  BASIC METABOLIC PANEL - Abnormal; Notable for the following:    Glucose, Bld 106 (*)     All other components within normal limits  URINALYSIS, ROUTINE W REFLEX MICROSCOPIC - Abnormal; Notable for the following:    Hgb urine dipstick LARGE (*)     All other components within normal limits  DIFFERENTIAL  URINE MICROSCOPIC-ADD ON   Dg Abd Acute W/chest  05/04/2012  *RADIOLOGY REPORT*  Clinical Data: Right flank pain, hematuria.  ACUTE ABDOMEN SERIES (ABDOMEN 2 VIEW & CHEST 1 VIEW)  Comparison: 02/01/2012  Findings: Lungs are clear.  Cardiomediastinal contours are within normal limits.  The bowel gas pattern is non-obstructive. No acute or aggressive osseous abnormality identified.  Surgical clips right upper quadrant. There are 2 radiodense foci projecting over the right renal shadow, measuring 3 mm. 2 mm radiodense focus projecting over the right pelvis.  No free intraperitoneal air.  IMPRESSION: Nonobstructive bowel gas pattern.  2 right renal stones.  2 mm radiodense focus projecting over the right pelvis may reflect a ureteral stone or phlebolith.  Original Report Authenticated By: Waneta Martins, M.D.     1. Right kidney stone       MDM  Reviewed labs and x-ray findings with pt.  Also, reviewed labs and CT from [facility] hospital from 02-01-12.  Doubt appendicitis based on presentation and today's work up.  Pt understand and agrees with tx.        Worthy Rancher, PA 05/04/12 1310

## 2012-05-04 NOTE — ED Notes (Signed)
Patient with no complaints at this time. Respirations even and unlabored. Skin warm/dry. Discharge instructions reviewed with patient at this time. Patient given opportunity to voice concerns/ask questions. IV removed per policy and band-aid applied to site. Patient discharged at this time and left Emergency Department with steady gait.  

## 2012-05-04 NOTE — ED Notes (Signed)
Pt reports that her "irregular bowel symdrome was acting up", +vomiting, right lower back pain, decreased urine output.

## 2012-05-04 NOTE — Discharge Instructions (Signed)
Kidney Stones Kidney stones (ureteral lithiasis) are solid masses that form inside your kidneys. The intense pain is caused by the stone moving through the kidney, ureter, bladder, and urethra (urinary tract). When the stone moves, the ureter starts to spasm around the stone. The stone is usually passed in the urine.  HOME CARE  Drink enough fluids to keep your pee (urine) clear or pale yellow. This helps to get the stone out.   Strain all pee through the provided strainer. Do not pee without peeing through the strainer, not even once. If you pee the stone out, catch it. The stone may be as small as a grain of salt. Take this to your doctor.   Only take medicine as told by your doctor.   Follow up with your doctor as told.   Get follow-up X-rays as told by your doctor.  GET HELP RIGHT AWAY IF:   Your pain does not get better with medicine.   You have a fever.   Your pain increases and gets worse over 18 hours.   You have new belly (abdominal) pain.   You feel faint or pass out.  MAKE SURE YOU:   Understand these instructions.   Will watch your condition.   Will get help right away if you are not doing well or get worse.  Document Released: 04/19/2008 Document Revised: 10/21/2011 Document Reviewed: 08/29/2009 [facility] Patient Information 2012 [place], [place].  Take the pain and nausea medicines as directed.  Drink plenty of fluids.  Follow up with your MD.  Return to ED if your symptoms worsen or change significantly in the meantime.

## 2012-05-05 NOTE — ED Provider Notes (Signed)
Medical screening examination/treatment/procedure(s) were performed by non-physician practitioner and as supervising physician I was immediately available for consultation/collaboration.   Tenishia Ekman W Ralph Benavidez, MD 05/05/12 2322 

## 2012-10-31 ENCOUNTER — Emergency Department (HOSPITAL_COMMUNITY): Payer: Self-pay

## 2012-10-31 ENCOUNTER — Emergency Department (HOSPITAL_COMMUNITY)
Admission: EM | Admit: 2012-10-31 | Discharge: 2012-10-31 | Disposition: A | Discharge status: Home / Self Care | Payer: Self-pay | Attending: Emergency Medicine | Admitting: Emergency Medicine

## 2012-10-31 ENCOUNTER — Encounter (HOSPITAL_COMMUNITY): Payer: Self-pay

## 2012-10-31 DIAGNOSIS — M545 Low back pain, unspecified: Secondary | ICD-10-CM | POA: Insufficient documentation

## 2012-10-31 DIAGNOSIS — R300 Dysuria: Secondary | ICD-10-CM | POA: Insufficient documentation

## 2012-10-31 DIAGNOSIS — R1031 Right lower quadrant pain: Secondary | ICD-10-CM | POA: Insufficient documentation

## 2012-10-31 DIAGNOSIS — Z8719 Personal history of other diseases of the digestive system: Secondary | ICD-10-CM | POA: Insufficient documentation

## 2012-10-31 DIAGNOSIS — Z8679 Personal history of other diseases of the circulatory system: Secondary | ICD-10-CM | POA: Insufficient documentation

## 2012-10-31 DIAGNOSIS — N200 Calculus of kidney: Secondary | ICD-10-CM | POA: Insufficient documentation

## 2012-10-31 DIAGNOSIS — F17200 Nicotine dependence, unspecified, uncomplicated: Secondary | ICD-10-CM | POA: Insufficient documentation

## 2012-10-31 LAB — URINALYSIS, ROUTINE W REFLEX MICROSCOPIC
Bilirubin Urine: NEGATIVE
Glucose, UA: NEGATIVE mg/dL
Ketones, ur: NEGATIVE mg/dL
Protein, ur: NEGATIVE mg/dL
pH: 6 (ref 5.0–8.0)

## 2012-10-31 MED ORDER — KETOROLAC TROMETHAMINE 30 MG/ML IJ SOLN
30.0000 mg | Freq: Once | INTRAMUSCULAR | Status: AC
  Administered 2012-10-31: 30 mg via INTRAVENOUS
  Filled 2012-10-31: qty 1

## 2012-10-31 MED ORDER — HYDROMORPHONE HCL PF 1 MG/ML IJ SOLN
1.0000 mg | Freq: Once | INTRAMUSCULAR | Status: AC
  Administered 2012-10-31: 1 mg via INTRAVENOUS
  Filled 2012-10-31: qty 1

## 2012-10-31 MED ORDER — PROMETHAZINE HCL 25 MG PO TABS: 25.0000 mg | ORAL_TABLET | Freq: Four times a day (QID) | ORAL | Status: AC | PRN

## 2012-10-31 MED ORDER — TAMSULOSIN HCL 0.4 MG PO CAPS: 0.4000 mg | ORAL_CAPSULE | Freq: Every day | ORAL | Status: DC

## 2012-10-31 MED ORDER — ONDANSETRON HCL 4 MG/2ML IJ SOLN
4.0000 mg | Freq: Once | INTRAMUSCULAR | Status: AC
  Administered 2012-10-31: 4 mg via INTRAVENOUS
  Filled 2012-10-31: qty 2

## 2012-10-31 MED ORDER — OXYCODONE-ACETAMINOPHEN 5-325 MG PO TABS: 1.0000 | ORAL_TABLET | Freq: Four times a day (QID) | ORAL | Status: DC | PRN

## 2012-10-31 MED ORDER — HYDROMORPHONE HCL PF 1 MG/ML IJ SOLN
0.5000 mg | Freq: Once | INTRAMUSCULAR | Status: AC
  Administered 2012-10-31: 0.5 mg via INTRAVENOUS
  Filled 2012-10-31: qty 1

## 2012-10-31 MED ORDER — SODIUM CHLORIDE 0.9 % IV BOLUS (SEPSIS)
500.0000 mL | Freq: Once | INTRAVENOUS | Status: AC
  Administered 2012-10-31: 500 mL via INTRAVENOUS

## 2012-10-31 NOTE — ED Notes (Signed)
N/v/d

## 2012-10-31 NOTE — ED Provider Notes (Signed)
History   This chart was scribed for [name], MD by Sofie Rower, ED Scribe. The patient was seen in room APA03/APA03 and the patient's care was started at 10:43AM.    CSN: [id number]  Arrival date & time 10/31/12  1020   First MD Initiated Contact with Patient 10/31/12 1043      Chief Complaint  Patient presents with  . Nausea    (Consider location/radiation/quality/duration/timing/severity/associated sxs/prior treatment) The history is provided by the patient. No language interpreter was used.    CINZIA DEVOS is a 51 y.o. female , with a hx of irregular bowel syndrome and kidney stones (6 separate episodes), who presents to the Emergency Department complaining of sudden, progressively worsening, back pain located at the right lower back, onset today (10/31/12). Associated symptoms include abdominal pain located at the RLQ, dysuria, and dark colored urine.  The pt denies hematuria.   The pt is a current everyday smoker (0.5 packs/day), however, she does not drink alcohol.   Past Medical History  Diagnosis Date  . Migraine   . Kidney stones   . IBS (irritable bowel syndrome)   . Kidney stones     Past Surgical History  Procedure Date  . Abdominal hysterectomy   . Cholecystectomy   . Eye surgery     No family history on file.  History  Substance Use Topics  . Smoking status: Current Every Day Smoker -- 0.5 packs/day    Types: Cigarettes  . Smokeless tobacco: Not on file  . Alcohol Use: No    OB History    Grav Para Term Preterm Abortions TAB SAB Ect Mult Living                  Review of Systems  Gastrointestinal: Positive for abdominal pain.  Genitourinary: Positive for dysuria and flank pain. Negative for hematuria.  All other systems reviewed and are negative.    Allergies  Penicillins and Sulfur  Home Medications   Current Outpatient Rx  Name  Route  Sig  Dispense  Refill  . IBUPROFEN 200 MG PO TABS   Oral   Take 800 mg by mouth every 6  (six) hours as needed. pain         . ADULT MULTIVITAMIN W/MINERALS CH   Oral   Take 1 tablet by mouth daily.         . OXYCODONE-ACETAMINOPHEN 5-325 MG PO TABS      One tab po q 4-6 hrs prn pain   15 tablet   0     BP 113/88  Pulse 99  Temp 97.8 F (36.6 C) (Oral)  Resp 20  Ht 5\' 6"  (1.676 m)  Wt 112 lb (50.803 kg)  BMI 18.08 kg/m2  SpO2 99%  Physical Exam  Nursing note and vitals reviewed. Constitutional: She is oriented to person, place, and time. She appears well-developed and well-nourished.  HENT:  Head: Normocephalic and atraumatic.  Eyes: Conjunctivae normal and EOM are normal. Pupils are equal, round, and reactive to light.  Neck: Normal range of motion. Neck supple.  Cardiovascular: Normal rate, regular rhythm and normal heart sounds.   Pulmonary/Chest: Effort normal and breath sounds normal.  Abdominal: Soft. Bowel sounds are normal. There is tenderness.       Tenderness detected at the RLQ.   Musculoskeletal: Normal range of motion. She exhibits tenderness.       Tenderness detected at the right flank.   Neurological: She is alert and oriented to person,  place, and time.  Skin: Skin is warm and dry.  Psychiatric: She has a normal mood and affect.    ED Course  Procedures (including critical care time)  DIAGNOSTIC STUDIES: Oxygen Saturation is 99% on room air, normal by my interpretation.    COORDINATION OF CARE:  11:19 AM- Treatment plan discussed with patient. Pt agrees with treatment.     Results for orders placed during the hospital encounter of 10/31/12  URINALYSIS, ROUTINE W REFLEX MICROSCOPIC      Component Value Range   Color, Urine YELLOW  YELLOW   APPearance CLEAR  CLEAR   Specific Gravity, Urine 1.025  1.005 - 1.030   pH 6.0  5.0 - 8.0   Glucose, UA NEGATIVE  NEGATIVE mg/dL   Hgb urine dipstick LARGE (*) NEGATIVE   Bilirubin Urine NEGATIVE  NEGATIVE   Ketones, ur NEGATIVE  NEGATIVE mg/dL   Protein, ur NEGATIVE  NEGATIVE mg/dL    Urobilinogen, UA 0.2  0.0 - 1.0 mg/dL   Nitrite NEGATIVE  NEGATIVE   Leukocytes, UA NEGATIVE  NEGATIVE  PREGNANCY, URINE      Component Value Range   Preg Test, Ur NEGATIVE  NEGATIVE  URINE MICROSCOPIC-ADD ON      Component Value Range   RBC / HPF TOO NUMEROUS TO COUNT  <3 RBC/hpf   Dg Abd 1 View  10/31/2012  *RADIOLOGY REPORT*  Clinical Data: Right flank pain question kidney stones, past history of kidney stones  ABDOMEN - 1 VIEW  Comparison: 05/04/2012  Findings: Bowel gas pattern normal. Surgical clips right upper quadrant question cholecystectomy. Single calcification 6 x 4 mm diameter projects over the right kidney. Tiny 2 mm calcification in the right pelvis, corresponding to a phlebolith on prior CT. No definite ureteral calcification is identified. Bones unremarkable.  IMPRESSION: 6 x 4 mm nonobstructing right renal calculus. No definite ureteral calcification is visualized.   Original Report Authenticated By: Ulyses Southward, M.D.       No diagnosis found.    MDM  History and physical consistent with a right-sided kidney stone.  Patient is hemodynamically stable.  Discharge  with Percocet #20, Phenergan 25 mg  #15, and Flomax 0.4 mg #20      I personally performed the services described in this documentation, which was scribed in my presence. The recorded information has been reviewed and is accurate.    [name], MD 10/31/12 [phone]

## 2019-05-21 ENCOUNTER — Ambulatory Visit (INDEPENDENT_AMBULATORY_CARE_PROVIDER_SITE_OTHER): Payer: Non-veteran care | Admitting: Otolaryngology

## 2019-05-21 DIAGNOSIS — K219 Gastro-esophageal reflux disease without esophagitis: Secondary | ICD-10-CM

## 2019-05-21 DIAGNOSIS — R490 Dysphonia: Secondary | ICD-10-CM

## 2019-05-21 DIAGNOSIS — F17210 Nicotine dependence, cigarettes, uncomplicated: Secondary | ICD-10-CM

## 2019-05-21 DIAGNOSIS — J310 Chronic rhinitis: Secondary | ICD-10-CM

## 2019-07-26 ENCOUNTER — Ambulatory Visit (INDEPENDENT_AMBULATORY_CARE_PROVIDER_SITE_OTHER): Payer: Non-veteran care | Admitting: Otolaryngology

## 2020-10-07 ENCOUNTER — Encounter: Payer: Non-veteran care | Admitting: Adult Health

## 2020-10-20 ENCOUNTER — Other Ambulatory Visit: Payer: Self-pay

## 2020-10-20 ENCOUNTER — Ambulatory Visit (INDEPENDENT_AMBULATORY_CARE_PROVIDER_SITE_OTHER): Payer: Non-veteran care | Admitting: Adult Health

## 2020-10-20 ENCOUNTER — Encounter: Payer: Self-pay | Admitting: Adult Health

## 2020-10-20 VITALS — BP 117/71 | HR 63 | Ht 67.0 in | Wt 90.0 lb

## 2020-10-20 DIAGNOSIS — A630 Anogenital (venereal) warts: Secondary | ICD-10-CM | POA: Insufficient documentation

## 2020-10-20 MED ORDER — IMIQUIMOD 5 % EX CREA: TOPICAL_CREAM | CUTANEOUS | 1 refills | Status: DC

## 2020-10-20 NOTE — Progress Notes (Signed)
  Subjective:     Patient ID: Carolyn Boyd, female   DOB: 17-Apr-1961, 59 y.o.   MRN: [id number]  HPI Carolyn Boyd is a 59 year old white female,separatted, sp hysterectomy, referred for genital warts. She had wart removed from her leg. She is going to [place] over Christmas holiday. PCP is Dr Margo Aye.  Review of Systems +warts No sex in 4 years Reviewed past medical,surgical, social and family history. Reviewed medications and allergies.     Objective:   Physical Exam BP 117/71 (BP Location: Left Arm, Patient Position: Sitting, Cuff Size: Normal)   Pulse 63   Ht 5\' 7"  (1.702 m)   Wt 90 lb (40.8 kg)   BMI 14.10 kg/m  Skin warm and dry.Pelvic: external genitalia is normal in appearance, she has 7-8 mm  wart on left labia  vagina:pale pink, with loss of moisture and rugae, no lesions seen,urethra has no lesions or masses noted, cervix and uterus are absent, adnexa: no masses or tenderness noted. Bladder is non tender and no masses felt. AA is 2 Fall risk is low  Upstream - 10/20/20 1332      Pregnancy Intention Screening   Does the patient want to become pregnant in the next year? N/A    Does the patient's partner want to become pregnant in the next year? N/A    Would the patient like to discuss contraceptive options today? No      Contraception Wrap Up   Current Method No Method - Other Reason   hyst   End Method No Method - Other Reason   hyst   Contraception Counseling Provided No          Examination chaperoned by Tish RN    Assessment:     1. Genital warts Will try Aldara Meds ordered this encounter  Medications  . imiquimod (ALDARA) 5 % cream    Sig: Apply topically 3 (three) times a week.    Dispense:  12 each    Refill:  1    Order Specific Question:   Supervising Provider    Answer:   14/06/21 [2510]    Plan:     Follow up in 7 weeks if wart not resolved, will excise then if she wants

## 2020-12-08 ENCOUNTER — Ambulatory Visit: Payer: Non-veteran care | Admitting: Adult Health

## 2021-08-25 ENCOUNTER — Other Ambulatory Visit (HOSPITAL_COMMUNITY): Payer: Self-pay | Admitting: Family Medicine

## 2021-08-25 DIAGNOSIS — Z1231 Encounter for screening mammogram for malignant neoplasm of breast: Secondary | ICD-10-CM

## 2021-08-25 DIAGNOSIS — Z13820 Encounter for screening for osteoporosis: Secondary | ICD-10-CM

## 2021-09-02 ENCOUNTER — Ambulatory Visit (HOSPITAL_COMMUNITY)
Admission: RE | Admit: 2021-09-02 | Discharge: 2021-09-02 | Disposition: A | Discharge status: Home / Self Care | Payer: Non-veteran care | Source: Ambulatory Visit | Attending: Family Medicine | Admitting: Family Medicine

## 2021-09-02 ENCOUNTER — Other Ambulatory Visit: Payer: Self-pay

## 2021-09-02 DIAGNOSIS — Z1231 Encounter for screening mammogram for malignant neoplasm of breast: Secondary | ICD-10-CM | POA: Insufficient documentation

## 2021-09-02 DIAGNOSIS — Z13820 Encounter for screening for osteoporosis: Secondary | ICD-10-CM | POA: Insufficient documentation

## 2021-10-26 ENCOUNTER — Emergency Department (HOSPITAL_COMMUNITY)

## 2021-10-26 ENCOUNTER — Encounter (HOSPITAL_COMMUNITY): Payer: Self-pay | Admitting: *Deleted

## 2021-10-26 ENCOUNTER — Emergency Department (HOSPITAL_COMMUNITY)
Admission: EM | Admit: 2021-10-26 | Discharge: 2021-10-27 | Disposition: A | Discharge status: Home / Self Care | Attending: Emergency Medicine | Admitting: Emergency Medicine

## 2021-10-26 DIAGNOSIS — K118 Other diseases of salivary glands: Secondary | ICD-10-CM | POA: Insufficient documentation

## 2021-10-26 DIAGNOSIS — M542 Cervicalgia: Secondary | ICD-10-CM | POA: Diagnosis not present

## 2021-10-26 DIAGNOSIS — F17210 Nicotine dependence, cigarettes, uncomplicated: Secondary | ICD-10-CM | POA: Diagnosis not present

## 2021-10-26 DIAGNOSIS — K1120 Sialoadenitis, unspecified: Secondary | ICD-10-CM

## 2021-10-26 DIAGNOSIS — R6884 Jaw pain: Secondary | ICD-10-CM | POA: Diagnosis present

## 2021-10-26 LAB — I-STAT CREATININE, ED: Creatinine, Ser: 0.8 mg/dL (ref 0.44–1.00)

## 2021-10-26 MED ORDER — HYDROCODONE-ACETAMINOPHEN 5-325 MG PO TABS
1.0000 | ORAL_TABLET | Freq: Once | ORAL | Status: AC
  Administered 2021-10-27: 1 via ORAL
  Filled 2021-10-26: qty 1

## 2021-10-26 MED ORDER — HYDROMORPHONE HCL 1 MG/ML IJ SOLN
0.5000 mg | Freq: Once | INTRAMUSCULAR | Status: AC
  Administered 2021-10-26: 0.5 mg via INTRAVENOUS
  Filled 2021-10-26: qty 1

## 2021-10-26 MED ORDER — IBUPROFEN 800 MG PO TABS
800.0000 mg | ORAL_TABLET | Freq: Once | ORAL | Status: AC
  Administered 2021-10-26: 800 mg via ORAL
  Filled 2021-10-26: qty 1

## 2021-10-26 MED ORDER — CLINDAMYCIN HCL 150 MG PO CAPS
300.0000 mg | ORAL_CAPSULE | Freq: Once | ORAL | Status: AC
  Administered 2021-10-27: 300 mg via ORAL
  Filled 2021-10-26: qty 2

## 2021-10-26 MED ORDER — IOHEXOL 350 MG/ML SOLN
75.0000 mL | Freq: Once | INTRAVENOUS | Status: AC | PRN
  Administered 2021-10-26: 75 mL via INTRAVENOUS

## 2021-10-26 MED ORDER — OXYCODONE-ACETAMINOPHEN 5-325 MG PO TABS
1.0000 | ORAL_TABLET | Freq: Once | ORAL | Status: AC
  Administered 2021-10-26: 1 via ORAL
  Filled 2021-10-26: qty 1

## 2021-10-26 MED ORDER — CLINDAMYCIN HCL 300 MG PO CAPS: 300.0000 mg | ORAL_CAPSULE | Freq: Three times a day (TID) | ORAL | 0 refills | Status: AC

## 2021-10-26 NOTE — ED Provider Notes (Signed)
[facility] EMERGENCY DEPARTMENT Provider Note   CSN: [id number] Arrival date & time: 10/26/21  1541     History Chief Complaint  Patient presents with   Ear Pain    Carolyn Boyd is a 60 y.o. female.  Pt complains of sudden onset of severe pain to left jaw and left neck.  Pt reports pain is severe. Pt states pain feel like muscle spasm.  Pt reports she had sudden pain that felt like it was in her jaw. Pt reports pain radiates to her ear and down her neck.  Pt denies fever or chill, no cough, no sinus pain.  Pt denies any oral pain   The history is provided by the patient. No language interpreter was used.  Neck Injury This is a new problem. The current episode started 12 to 24 hours ago. The problem occurs constantly. Nothing aggravates the symptoms. She has tried nothing for the symptoms.      Past Medical History:  Diagnosis Date   IBS (irritable bowel syndrome)    Kidney stones    Kidney stones    Migraine     Patient Active Problem List   Diagnosis Date Noted   Genital warts 10/20/2020   Migraine     Past Surgical History:  Procedure Laterality Date   ABDOMINAL HYSTERECTOMY     CHOLECYSTECTOMY     EYE SURGERY       OB History     Gravida  4   Para  4   Term      Preterm      AB      Living  3      SAB      IAB      Ectopic      Multiple      Live Births              No family history on file.  Social History   Tobacco Use   Smoking status: Every Day    Packs/day: 0.50    Types: Cigarettes   Smokeless tobacco: Never  Vaping Use   Vaping Use: Never used  Substance Use Topics   Alcohol use: No   Drug use: No    Home Medications Prior to Admission medications   Medication Sig Start Date End Date Taking? Authorizing Provider  Biotin 1000 MCG tablet Take by mouth.    [provider]  estrogens, conjugated, (PREMARIN) 0.625 MG tablet Take by mouth.    [provider]  HYDROcodone-acetaminophen  (NORCO/VICODIN) 5-325 MG tablet Take 1 tablet by mouth daily as needed. 09/26/20   [provider]  ibuprofen (ADVIL,MOTRIN) 200 MG tablet Take 800 mg by mouth every 6 (six) hours as needed. pain Patient not taking: Reported on 10/20/2020    [provider]  imiquimod (ALDARA) 5 % cream Apply topically 3 (three) times a week. 10/20/20   [name], NP  Multiple Vitamin (MULITIVITAMIN WITH MINERALS) TABS Take 1 tablet by mouth daily.    [provider]  promethazine (PHENERGAN) 25 MG tablet Take 1 tablet (25 mg total) by mouth every 6 (six) hours as needed for nausea. Patient not taking: Reported on 10/20/2020 10/31/12   [name], MD  Tamsulosin HCl (FLOMAX) 0.4 MG CAPS Take 1 capsule (0.4 mg total) by mouth daily. Patient not taking: Reported on 10/20/2020 10/31/12   [name], MD  traZODone (DESYREL) 50 MG tablet  08/26/20   [provider]  dicyclomine (BENTYL) 10  MG capsule Take 10 mg by mouth 4 (four) times daily -  before meals and at bedtime.    01/31/12  [provider]  esomeprazole (NEXIUM) 40 MG capsule Take 40 mg by mouth daily before breakfast.    01/31/12  [provider]    Allergies    Elemental sulfur and Penicillins  Review of Systems   Review of Systems  Musculoskeletal:  Positive for neck pain. Negative for neck stiffness.  Skin:  Positive for color change.  All other systems reviewed and are negative.  Physical Exam Updated Vital Signs BP 120/62 (BP Location: Right Arm)   Pulse (!) 50   Temp 98.2 F (36.8 C) (Oral)   Resp 17   SpO2 98%   Physical Exam Vitals and nursing note reviewed.  Constitutional:      Appearance: She is well-developed.  HENT:     Head: Normocephalic.     Comments: Tender left TMj area and left neck.  Ear nontender,     Right Ear: Tympanic membrane normal.     Left Ear: Tympanic membrane normal.     Nose: Nose normal.     Mouth/Throat:     Mouth: Mucous membranes are  moist.     Pharynx: No oropharyngeal exudate or posterior oropharyngeal erythema.  Eyes:     Pupils: Pupils are equal, round, and reactive to light.  Cardiovascular:     Rate and Rhythm: Normal rate.     Pulses: Normal pulses.  Pulmonary:     Effort: Pulmonary effort is normal.  Abdominal:     General: There is no distension.  Musculoskeletal:        General: Normal range of motion.     Cervical back: Normal range of motion.  Skin:    General: Skin is warm.  Neurological:     General: No focal deficit present.     Mental Status: She is alert and oriented to person, place, and time.  Psychiatric:        Mood and Affect: Mood normal.    ED Results / Procedures / Treatments   Labs (all labs ordered are listed, but only abnormal results are displayed) Labs Reviewed  I-STAT CREATININE, ED    EKG None  Radiology No results found.  Procedures Procedures   Medications Ordered in ED Medications  HYDROmorphone (DILAUDID) injection 0.5 mg (has no administration in time range)  ibuprofen (ADVIL) tablet 800 mg (800 mg Oral Given 10/26/21 1908)  oxyCODONE-acetaminophen (PERCOCET/ROXICET) 5-325 MG per tablet 1 tablet (1 tablet Oral Given 10/26/21 1908)    ED Course  I have reviewed the triage vital signs and the nursing notes.  Pertinent labs & imaging results that were available during my care of the patient were reviewed by me and considered in my medical decision making (see chart for details).    MDM Rules/Calculators/A&P                           MDM:  Pt given ibuprofen and percocet,  Pt reports pain increasing.   Ct angio obtained,  no vascular abnormality,  Parotid swelling.   Final Clinical Impression(s) / ED Diagnoses Final diagnoses:  Parotiditis    Rx / DC Orders ED Discharge Orders          Ordered    clindamycin (CLEOCIN) 300 MG capsule  3 times daily        10/26/21 2330  An After Visit Summary was printed and given to the patient.     Osie Cheeks 10/27/21 2126    Terrilee Files, MD 10/28/21 1155

## 2021-10-26 NOTE — ED Triage Notes (Signed)
Left ear pain with facial swelling

## 2023-04-08 IMAGING — CT CT ANGIO NECK
2 of 7 series · 8 of 33 positions shown · IV contrast (omnipaque)
Comparison: None.

CLINICAL DATA: Neck pain acute, no red flags, sudden onset
left-sided jaw pain

EXAM:
CT ANGIOGRAPHY NECK
TECHNIQUE: Multidetector CT imaging of the neck was performed using the
standard protocol during bolus administration of intravenous
contrast. Multiplanar CT image reconstructions and MIPs were
obtained to evaluate the vascular anatomy. Carotid stenosis
measurements (when applicable) are obtained utilizing NASCET
criteria, using the distal internal carotid diameter as the
denominator.
CONTRAST:  75mL OMNIPAQUE IOHEXOL 350 MG/ML SOLN

[Series 4: cta neck · axial · 0.43mm/px · z∈[+1035,+1123]mm · 2 of 134 slices shown]
[im 45/134  soft-tissue]
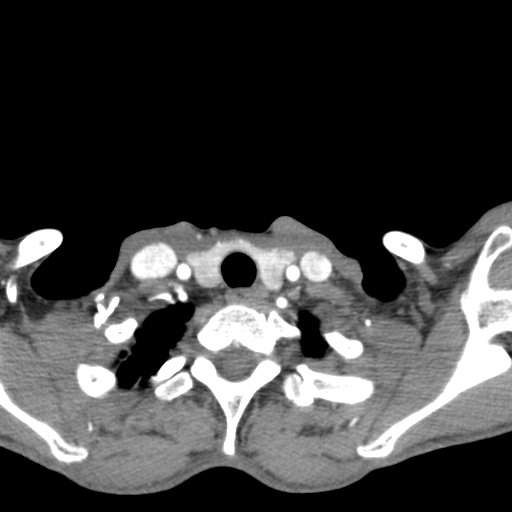
[im 89/134  soft-tissue]
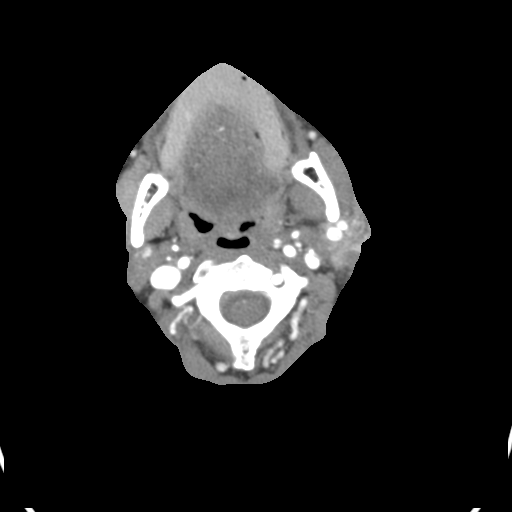

[Series 6: ax thin · axial · 0.39mm/px · z∈[+973,+1162]mm · 6 of 266 slices shown]
[im 38/266  soft-tissue]
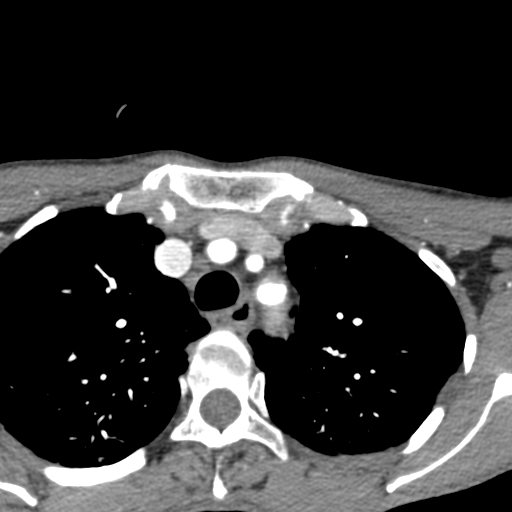
[im 76/266  bone]
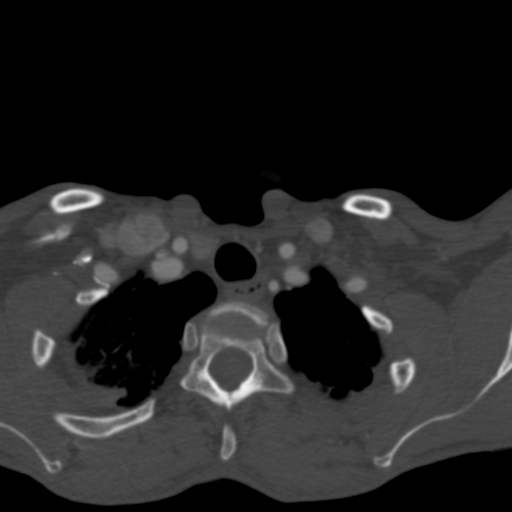
[im 114/266  soft-tissue]
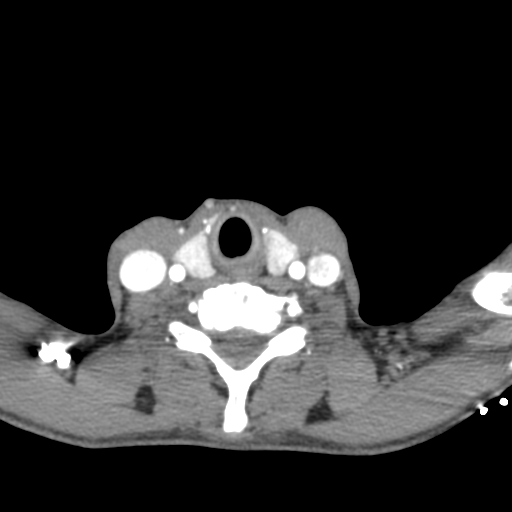
[im 152/266  bone]
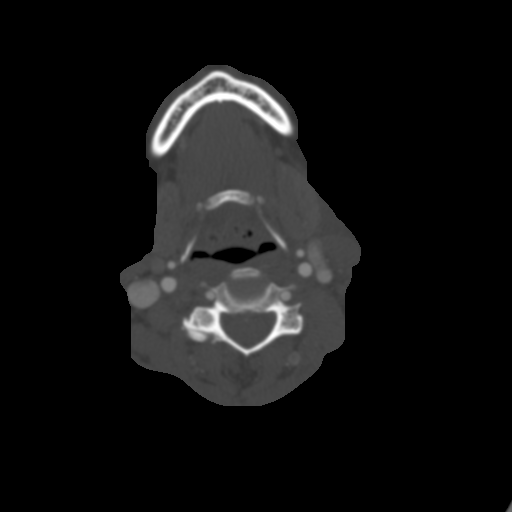
[im 190/266  soft-tissue]
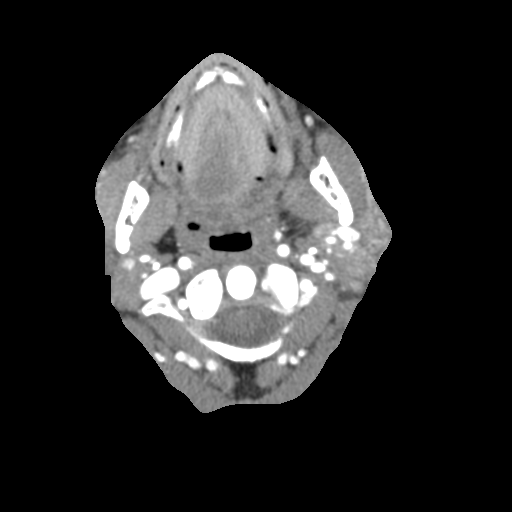
[im 228/266  bone]
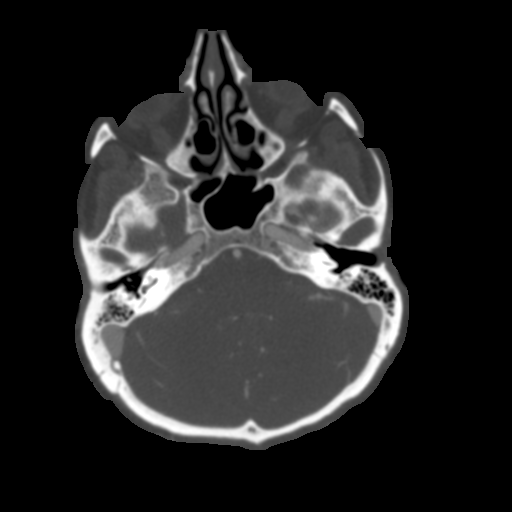

[8 of 33 positions shown; findings below may reference images not displayed]

FINDINGS: Aortic arch: Standard branching. Imaged portion shows no evidence of
aneurysm or dissection. No significant stenosis of the major arch
vessel origins. Minimal aortic atherosclerotic disease.

Right carotid system: No evidence of dissection, stenosis (50% or
greater) or occlusion.

Left carotid system: No evidence of dissection, stenosis (50% or
greater) or occlusion.

Vertebral arteries: Codominant. No evidence of dissection, stenosis
(50% or greater) or occlusion.

Skeleton: Mild degenerative changes in the cervical spine. No acute
osseous abnormality.

Other neck: Hyperenhancement and enlargement of the left parotid
gland, with adjacent stranding in the subcutaneous tissues of the
left neck. No focal stone or mass is seen.

Upper chest: Centrilobular and paraseptal emphysema, with biapical
pleural thickening and scarring.

Mucosal thickening in the right maxillary sinus.
IMPRESSION: 1. Hyperenhancement and enlargement of the left parotid gland, with
adjacent fat stranding, concerning for parotitis. No focal stone or
mass is seen.
2. No hemodynamically significant stenosis in the neck.
3.  Emphysema ([id number]).

## 2024-04-04 ENCOUNTER — Ambulatory Visit (HOSPITAL_COMMUNITY)
Admission: RE | Admit: 2024-04-04 | Discharge: 2024-04-04 | Disposition: A | Discharge status: Home / Self Care | Source: Ambulatory Visit | Attending: Nurse Practitioner | Admitting: Nurse Practitioner

## 2024-04-04 ENCOUNTER — Other Ambulatory Visit (HOSPITAL_COMMUNITY): Payer: Self-pay | Admitting: Nurse Practitioner

## 2024-04-04 DIAGNOSIS — R634 Abnormal weight loss: Secondary | ICD-10-CM

## 2024-04-16 ENCOUNTER — Encounter (INDEPENDENT_AMBULATORY_CARE_PROVIDER_SITE_OTHER): Payer: Self-pay | Admitting: *Deleted

## 2024-05-01 ENCOUNTER — Encounter (INDEPENDENT_AMBULATORY_CARE_PROVIDER_SITE_OTHER): Payer: Self-pay | Admitting: Gastroenterology

## 2024-05-01 ENCOUNTER — Other Ambulatory Visit: Payer: Self-pay | Admitting: *Deleted

## 2024-05-01 ENCOUNTER — Ambulatory Visit (INDEPENDENT_AMBULATORY_CARE_PROVIDER_SITE_OTHER): Admitting: Gastroenterology

## 2024-05-01 ENCOUNTER — Encounter: Payer: Self-pay | Admitting: *Deleted

## 2024-05-01 VITALS — BP 125/62 | HR 63 | Temp 97.6°F | Ht 67.0 in | Wt 87.9 lb

## 2024-05-01 DIAGNOSIS — R630 Anorexia: Secondary | ICD-10-CM | POA: Diagnosis not present

## 2024-05-01 DIAGNOSIS — F17200 Nicotine dependence, unspecified, uncomplicated: Secondary | ICD-10-CM | POA: Insufficient documentation

## 2024-05-01 DIAGNOSIS — Z1211 Encounter for screening for malignant neoplasm of colon: Secondary | ICD-10-CM | POA: Insufficient documentation

## 2024-05-01 DIAGNOSIS — Z681 Body mass index (BMI) 19 or less, adult: Secondary | ICD-10-CM | POA: Insufficient documentation

## 2024-05-01 DIAGNOSIS — Z800 Family history of malignant neoplasm of digestive organs: Secondary | ICD-10-CM | POA: Diagnosis not present

## 2024-05-01 DIAGNOSIS — F17210 Nicotine dependence, cigarettes, uncomplicated: Secondary | ICD-10-CM

## 2024-05-01 DIAGNOSIS — R634 Abnormal weight loss: Secondary | ICD-10-CM | POA: Insufficient documentation

## 2024-05-01 MED ORDER — PEG 3350-KCL-NA BICARB-NACL 420 G PO SOLR: 4000.0000 mL | Freq: Once | ORAL | 0 refills | Status: AC

## 2024-05-01 NOTE — Patient Instructions (Signed)
 It was very nice to meet you today, as dicussed with will plan for the following :  1) upper endoscopy and colonoscopy

## 2024-05-01 NOTE — H&P (View-Only) (Signed)
 Carolyn Boyd , M.D. Gastroenterology & Hepatology [facility] [address] [place], [place] [address] Primary Care Physician: Omie Bickers, MD [address] [place] [address]  Chief Complaint: Unintentional weight loss, screening colonoscopy  History of Present Illness: Carolyn Boyd is a 63 y.o. female with migraines, current cigarette smoking who presents for evaluation of Unintentional weight loss, loss of appetite  screening colonoscopy.  Patient reports that all her life she has been underweight as her usual weight is between 95 to 100 pounds.  But recently patient has lost her appetite and continues to lose weight.  Today patient weighs 87 pounds which is the lowest she has been so far.  Reports losing from March 2025 to May 2000 2512 pounds.  Patient was placed on antidepressant which has slightly helped her appetite but she is still concerned about progressive weight loss.  Patient does report stress in life as she is taking care of her elderly mother.The patient denies having any nausea, vomiting, fever, chills, hematochezia, melena, hematemesis, abdominal distention, abdominal pain, diarrhea, jaundice, pruritus   Most recent labs TSH 0.5 T41.26 Hemoglobin 13 platelet 176 normal liver enzymes  Last NWG:NFAO Last Colonoscopy:over 10 years ago  FHx: Grandmother colon cancer Social: Current cigarette smoking Surgical: Hysterectomy  Past Medical History: Past Medical History:  Diagnosis Date   IBS (irritable bowel syndrome)    Kidney stones    Kidney stones    Migraine     Past Surgical History: Past Surgical History:  Procedure Laterality Date   ABDOMINAL HYSTERECTOMY     CHOLECYSTECTOMY     EYE SURGERY      Family History:History reviewed. No pertinent family history.  Social History: Social History   Tobacco Use  Smoking Status Every Day   Current packs/day: 0.50   Types: Cigarettes  Smokeless  Tobacco Never   Social History   Substance and Sexual Activity  Alcohol Use No   Social History   Substance and Sexual Activity  Drug Use No    Allergies: Allergies  Allergen Reactions   Elemental Sulfur Swelling   Penicillins Hives    Medications: Current Outpatient Medications  Medication Sig Dispense Refill   Biotin 1000 MCG tablet Take by mouth.     escitalopram (LEXAPRO) 5 MG tablet Take 5 mg by mouth daily.     estrogens, conjugated, (PREMARIN) 0.625 MG tablet Take by mouth.     HYDROcodone -acetaminophen  (NORCO/VICODIN) 5-325 MG tablet Take 1 tablet by mouth daily as needed.     ibuprofen  (ADVIL ,MOTRIN ) 200 MG tablet Take 800 mg by mouth every 6 (six) hours as needed. pain     Multiple Vitamin (MULITIVITAMIN WITH MINERALS) TABS Take 1 tablet by mouth daily.     promethazine  (PHENERGAN ) 25 MG tablet Take 1 tablet (25 mg total) by mouth every 6 (six) hours as needed for nausea. 20 tablet 0   traZODone (DESYREL) 50 MG tablet      No current facility-administered medications for this visit.    Review of Systems: GENERAL: negative for malaise, night sweats HEENT: No changes in hearing or vision, no nose bleeds or other nasal problems. NECK: Negative for lumps, goiter, pain and significant neck swelling RESPIRATORY: Negative for cough, wheezing CARDIOVASCULAR: Negative for chest pain, leg swelling, palpitations, orthopnea GI: SEE HPI MUSCULOSKELETAL: Negative for joint pain or swelling, back pain, and muscle pain. SKIN: Negative for lesions, rash HEMATOLOGY Negative for prolonged bleeding, bruising easily, and swollen nodes. ENDOCRINE:  Negative for cold or heat intolerance, polyuria, polydipsia and goiter. NEURO: negative for tremor, gait imbalance, syncope and seizures. The remainder of the review of systems is noncontributory.   Physical Exam: BP 125/62   Pulse 63   Temp 97.6 F (36.4 C)   Ht 5' 7 (1.702 m)   Wt 87 lb 14.4 oz (39.9 kg)   BMI 13.77 kg/m   GENERAL: The patient is AO x3, in no acute distress. HEENT: Head is normocephalic and atraumatic. EOMI are intact. Mouth is well hydrated and without lesions. NECK: Supple. No masses LUNGS: Clear to auscultation. No presence of rhonchi/wheezing/rales. Adequate chest expansion HEART: RRR, normal s1 and s2. ABDOMEN: Soft, nontender, no guarding, no peritoneal signs, and nondistended. BS +. No masses.  Imaging/Labs: as above     Latest Ref Rng & Units 05/04/2012   10:56 AM 01/31/2012   11:02 AM 10/27/2011    2:25 PM  CBC  WBC 4.0 - 10.5 K/uL 5.9  6.2  7.0   Hemoglobin 12.0 - 15.0 g/dL 47.8  29.5  62.1   Hematocrit 36.0 - 46.0 % 44.0  41.1  43.1   Platelets 150 - 400 K/uL 132  143  137    No results found for: IRON, TIBC, FERRITIN  I personally reviewed and interpreted the available labs, imaging and endoscopic files.  CT Chest Ab and Pelvis without contrast 03/2024  IMPRESSION: 1. No noncontrast CT findings of the chest, abdomen, or pelvis to explain weight loss. 2. Moderate emphysema and diffuse bilateral bronchial wall thickening. 3. Coronary artery disease.   Impression and Plan:  Carolyn Boyd is a 63 y.o. female with migraines, current cigarette smoking who presents for evaluation of Unintentional weight loss, loss of appetite  screening colonoscopy.  #Unintentional weight loss #Loss of appetite   Patient is an active smoker, age 78 , now with two alarm symptom which is progressive weight loss and loss of appetite.  Patient usually is 95 to 100 pounds and today weighs 87 pounds which is the lowest she has ever been  Given active smoking need to rule out any pancreatic lesion, although patient had a recent CT scan which was without IV contrast and is not an optimal study to evaluate pancreas  Given loss of appetite and progressive weight loss we will proceed with upper endoscopy and colonoscopy  If bidirectional endoscopy is negative we will pursue CT abdomen  pelvis with IV contrast pancreatic protocol  #Colon cancer screening   Patient does have family history of colon cancer in grandmother  The patient was counseled regarding the importance of colorectal cancer screening, The benefits of screening include early detection of colorectal cancer and precancerous polyps, which can improve treatment outcomes and reduce mortality. Risks associated with screening, particularly colonoscopy, include potential complications such as bleeding and perforation. After deciding different modalities for screening for colon cancer , patient has opted to pursue Colonoscopy   #BMI 13  Patient is severely underweight underweight,, has been like this for most of her life , but today weighs slowly so far.  Recent blood work without anemia normal TSH.  Will proceed with bidirectional endoscopy if negative will obtain CT abdomen pelvis IV contrast for pancreatic protocol Would benefit from dietitian consult  All questions were answered.      Laniece Hornbaker Faizan Linnaea Ahn, MD Gastroenterology and Hepatology [facility]   This chart has been completed using [facility] Dictation software, and while attempts have been made to ensure accuracy ,  certain words and phrases may not be transcribed as intended

## 2024-05-01 NOTE — Progress Notes (Signed)
 [name] [name] , M.D. Gastroenterology & Hepatology [facility] [address] [place], [place] [address] Primary Care Physician: Omie Bickers, MD [address] [place] [address]  Chief Complaint: Unintentional weight loss, screening colonoscopy  History of Present Illness: Carolyn Boyd is a 63 y.o. female with migraines, current cigarette smoking who presents for evaluation of Unintentional weight loss, loss of appetite  screening colonoscopy.  Patient reports that all her life she has been underweight as her usual weight is between 95 to 100 pounds.  But recently patient has lost her appetite and continues to lose weight.  Today patient weighs 87 pounds which is the lowest she has been so far.  Reports losing from March 2025 to May 2000 2512 pounds.  Patient was placed on antidepressant which has slightly helped her appetite but she is still concerned about progressive weight loss.  Patient does report stress in life as she is taking care of her elderly mother.The patient denies having any nausea, vomiting, fever, chills, hematochezia, melena, hematemesis, abdominal distention, abdominal pain, diarrhea, jaundice, pruritus   Most recent labs TSH 0.5 T41.26 Hemoglobin 13 platelet 176 normal liver enzymes  Last NWG:NFAO Last Colonoscopy:over 10 years ago  FHx: Grandmother colon cancer Social: Current cigarette smoking Surgical: Hysterectomy  Past Medical History: Past Medical History:  Diagnosis Date   IBS (irritable bowel syndrome)    Kidney stones    Kidney stones    Migraine     Past Surgical History: Past Surgical History:  Procedure Laterality Date   ABDOMINAL HYSTERECTOMY     CHOLECYSTECTOMY     EYE SURGERY      Family History:History reviewed. No pertinent family history.  Social History: Social History   Tobacco Use  Smoking Status Every Day   Current packs/day: 0.50   Types: Cigarettes  Smokeless  Tobacco Never   Social History   Substance and Sexual Activity  Alcohol Use No   Social History   Substance and Sexual Activity  Drug Use No    Allergies: Allergies  Allergen Reactions   Elemental Sulfur Swelling   Penicillins Hives    Medications: Current Outpatient Medications  Medication Sig Dispense Refill   Biotin 1000 MCG tablet Take by mouth.     escitalopram (LEXAPRO) 5 MG tablet Take 5 mg by mouth daily.     estrogens, conjugated, (PREMARIN) 0.625 MG tablet Take by mouth.     HYDROcodone -acetaminophen  (NORCO/VICODIN) 5-325 MG tablet Take 1 tablet by mouth daily as needed.     ibuprofen  (ADVIL ,MOTRIN ) 200 MG tablet Take 800 mg by mouth every 6 (six) hours as needed. pain     Multiple Vitamin (MULITIVITAMIN WITH MINERALS) TABS Take 1 tablet by mouth daily.     promethazine  (PHENERGAN ) 25 MG tablet Take 1 tablet (25 mg total) by mouth every 6 (six) hours as needed for nausea. 20 tablet 0   traZODone (DESYREL) 50 MG tablet      No current facility-administered medications for this visit.    Review of Systems: GENERAL: negative for malaise, night sweats HEENT: No changes in hearing or vision, no nose bleeds or other nasal problems. NECK: Negative for lumps, goiter, pain and significant neck swelling RESPIRATORY: Negative for cough, wheezing CARDIOVASCULAR: Negative for chest pain, leg swelling, palpitations, orthopnea GI: SEE HPI MUSCULOSKELETAL: Negative for joint pain or swelling, back pain, and muscle pain. SKIN: Negative for lesions, rash HEMATOLOGY Negative for prolonged bleeding, bruising easily, and swollen nodes. ENDOCRINE:  Negative for cold or heat intolerance, polyuria, polydipsia and goiter. NEURO: negative for tremor, gait imbalance, syncope and seizures. The remainder of the review of systems is noncontributory.   Physical Exam: BP 125/62   Pulse 63   Temp 97.6 F (36.4 C)   Ht 5' 7 (1.702 m)   Wt 87 lb 14.4 oz (39.9 kg)   BMI 13.77 kg/m   GENERAL: The patient is AO x3, in no acute distress. HEENT: Head is normocephalic and atraumatic. EOMI are intact. Mouth is well hydrated and without lesions. NECK: Supple. No masses LUNGS: Clear to auscultation. No presence of rhonchi/wheezing/rales. Adequate chest expansion HEART: RRR, normal s1 and s2. ABDOMEN: Soft, nontender, no guarding, no peritoneal signs, and nondistended. BS +. No masses.  Imaging/Labs: as above     Latest Ref Rng & Units 05/04/2012   10:56 AM 01/31/2012   11:02 AM 10/27/2011    2:25 PM  CBC  WBC 4.0 - 10.5 K/uL 5.9  6.2  7.0   Hemoglobin 12.0 - 15.0 g/dL 47.8  29.5  62.1   Hematocrit 36.0 - 46.0 % 44.0  41.1  43.1   Platelets 150 - 400 K/uL 132  143  137    No results found for: IRON, TIBC, FERRITIN  I personally reviewed and interpreted the available labs, imaging and endoscopic files.  CT Chest Ab and Pelvis without contrast 03/2024  IMPRESSION: 1. No noncontrast CT findings of the chest, abdomen, or pelvis to explain weight loss. 2. Moderate emphysema and diffuse bilateral bronchial wall thickening. 3. Coronary artery disease.   Impression and Plan:  Carolyn Boyd is a 63 y.o. female with migraines, current cigarette smoking who presents for evaluation of Unintentional weight loss, loss of appetite  screening colonoscopy.  #Unintentional weight loss #Loss of appetite   Patient is an active smoker, age 63 , now with two alarm symptom which is progressive weight loss and loss of appetite.  Patient usually is 95 to 100 pounds and today weighs 87 pounds which is the lowest she has ever been  Given active smoking need to rule out any pancreatic lesion, although patient had a recent CT scan which was without IV contrast and is not an optimal study to evaluate pancreas  Given loss of appetite and progressive weight loss we will proceed with upper endoscopy and colonoscopy  If bidirectional endoscopy is negative we will pursue CT abdomen  pelvis with IV contrast pancreatic protocol  #Colon cancer screening   Patient does have family history of colon cancer in grandmother  The patient was counseled regarding the importance of colorectal cancer screening, The benefits of screening include early detection of colorectal cancer and precancerous polyps, which can improve treatment outcomes and reduce mortality. Risks associated with screening, particularly colonoscopy, include potential complications such as bleeding and perforation. After deciding different modalities for screening for colon cancer , patient has opted to pursue Colonoscopy   #BMI 13  Patient is severely underweight underweight,, has been like this for most of her life , but today weighs slowly so far.  Recent blood work without anemia normal TSH.  Will proceed with bidirectional endoscopy if negative will obtain CT abdomen pelvis IV contrast for pancreatic protocol Would benefit from dietitian consult  All questions were answered.      Carolyn Hornbaker Faizan Linnaea Ahn, MD Gastroenterology and Hepatology [facility]   This chart has been completed using [facility] Dictation software, and while attempts have been made to ensure accuracy ,  certain words and phrases may not be transcribed as intended

## 2024-05-23 ENCOUNTER — Encounter (HOSPITAL_COMMUNITY): Payer: Self-pay | Admitting: Gastroenterology

## 2024-05-23 ENCOUNTER — Encounter (INDEPENDENT_AMBULATORY_CARE_PROVIDER_SITE_OTHER): Payer: Self-pay | Admitting: *Deleted

## 2024-05-23 ENCOUNTER — Ambulatory Visit (HOSPITAL_COMMUNITY)
Admission: RE | Admit: 2024-05-23 | Discharge: 2024-05-23 | Disposition: A | Discharge status: Home / Self Care | Attending: Gastroenterology | Admitting: Gastroenterology

## 2024-05-23 ENCOUNTER — Ambulatory Visit (HOSPITAL_COMMUNITY): Admitting: Certified Registered"

## 2024-05-23 ENCOUNTER — Encounter (HOSPITAL_COMMUNITY)
Admission: RE | Disposition: A | Discharge status: Home / Self Care | Payer: Self-pay | Source: Home / Self Care | Attending: Gastroenterology

## 2024-05-23 ENCOUNTER — Other Ambulatory Visit: Payer: Self-pay

## 2024-05-23 ENCOUNTER — Telehealth: Payer: Self-pay | Admitting: *Deleted

## 2024-05-23 DIAGNOSIS — Z6379 Other stressful life events affecting family and household: Secondary | ICD-10-CM | POA: Diagnosis not present

## 2024-05-23 DIAGNOSIS — Z681 Body mass index (BMI) 19 or less, adult: Secondary | ICD-10-CM | POA: Diagnosis not present

## 2024-05-23 DIAGNOSIS — F17210 Nicotine dependence, cigarettes, uncomplicated: Secondary | ICD-10-CM | POA: Diagnosis not present

## 2024-05-23 DIAGNOSIS — K6389 Other specified diseases of intestine: Secondary | ICD-10-CM

## 2024-05-23 DIAGNOSIS — R636 Underweight: Secondary | ICD-10-CM | POA: Diagnosis not present

## 2024-05-23 DIAGNOSIS — Z1211 Encounter for screening for malignant neoplasm of colon: Secondary | ICD-10-CM | POA: Diagnosis present

## 2024-05-23 DIAGNOSIS — K449 Diaphragmatic hernia without obstruction or gangrene: Secondary | ICD-10-CM | POA: Insufficient documentation

## 2024-05-23 DIAGNOSIS — R634 Abnormal weight loss: Secondary | ICD-10-CM

## 2024-05-23 DIAGNOSIS — K635 Polyp of colon: Secondary | ICD-10-CM | POA: Diagnosis not present

## 2024-05-23 DIAGNOSIS — I10 Essential (primary) hypertension: Secondary | ICD-10-CM | POA: Insufficient documentation

## 2024-05-23 DIAGNOSIS — K648 Other hemorrhoids: Secondary | ICD-10-CM | POA: Diagnosis not present

## 2024-05-23 DIAGNOSIS — K2289 Other specified disease of esophagus: Secondary | ICD-10-CM | POA: Diagnosis not present

## 2024-05-23 DIAGNOSIS — G43909 Migraine, unspecified, not intractable, without status migrainosus: Secondary | ICD-10-CM | POA: Insufficient documentation

## 2024-05-23 DIAGNOSIS — D122 Benign neoplasm of ascending colon: Secondary | ICD-10-CM | POA: Diagnosis not present

## 2024-05-23 DIAGNOSIS — K649 Unspecified hemorrhoids: Secondary | ICD-10-CM | POA: Diagnosis not present

## 2024-05-23 DIAGNOSIS — K2970 Gastritis, unspecified, without bleeding: Secondary | ICD-10-CM | POA: Diagnosis not present

## 2024-05-23 HISTORY — PX: ESOPHAGOGASTRODUODENOSCOPY: SHX5428

## 2024-05-23 HISTORY — PX: COLONOSCOPY: SHX5424

## 2024-05-23 LAB — HM COLONOSCOPY

## 2024-05-23 SURGERY — COLONOSCOPY
Anesthesia: General

## 2024-05-23 MED ORDER — LIDOCAINE 2% (20 MG/ML) 5 ML SYRINGE
INTRAMUSCULAR | Status: DC | PRN
  Administered 2024-05-23: 60 mg via INTRAVENOUS

## 2024-05-23 MED ORDER — LACTATED RINGERS IV SOLN: INTRAVENOUS | Status: DC | PRN

## 2024-05-23 MED ORDER — LACTATED RINGERS IV SOLN: INTRAVENOUS | Status: DC

## 2024-05-23 MED ORDER — PROPOFOL 10 MG/ML IV BOLUS
INTRAVENOUS | Status: DC | PRN
Start: 2024-05-23 — End: 2024-05-23
  Administered 2024-05-23: 70 mg via INTRAVENOUS
  Administered 2024-05-23: 100 ug/kg/min via INTRAVENOUS
  Administered 2024-05-23: 40 mg via INTRAVENOUS

## 2024-05-23 MED ORDER — EPHEDRINE SULFATE-NACL 50-0.9 MG/10ML-% IV SOSY
PREFILLED_SYRINGE | INTRAVENOUS | Status: DC | PRN
Start: 2024-05-23 — End: 2024-05-23
  Administered 2024-05-23: 5 mg via INTRAVENOUS

## 2024-05-23 NOTE — Interval H&P Note (Signed)
 History and Physical Interval Note:  05/23/2024 1:11 PM  Carolyn Boyd  has presented today for surgery, with the diagnosis of WEIGHT LOSS, SCREENING COLONOSCOPY, ANOREXIA.  The various methods of treatment have been discussed with the patient and family. After consideration of risks, benefits and other options for treatment, the patient has consented to  Procedure(s) with comments: COLONOSCOPY (N/A) - 130PM, ASA 1-2 EGD (ESOPHAGOGASTRODUODENOSCOPY) (N/A) as a surgical intervention.  The patient's history has been reviewed, patient examined, no change in status, stable for surgery.  I have reviewed the patient's chart and labs.  Questions were answered to the patient's satisfaction.     Deatrice FALCON Karine Garn

## 2024-05-23 NOTE — Transfer of Care (Signed)
 Immediate Anesthesia Transfer of Care Note  Patient: Carolyn Boyd  Procedure(s) Performed: COLONOSCOPY EGD (ESOPHAGOGASTRODUODENOSCOPY)  Patient Location: PACU  Anesthesia Type:General  Level of Consciousness: awake, alert , oriented, and patient cooperative  Airway & Oxygen Therapy: Patient Spontanous Breathing  Post-op Assessment: Report given to RN, Post -op Vital signs reviewed and stable, and Patient moving all extremities X 4  Post vital signs: Reviewed and stable  Last Vitals:  Vitals Value Taken Time  BP 162/66 05/23/24 14:03  Temp 36.4 C 05/23/24 14:03  Pulse 70 05/23/24 14:03  Resp 23 05/23/24 14:03  SpO2 100 % 05/23/24 14:03    Last Pain:  Vitals:   05/23/24 1403  TempSrc: Axillary  PainSc: 0-No pain      Patients Stated Pain Goal: 3 (05/23/24 1131)  Complications: No notable events documented.

## 2024-05-23 NOTE — Telephone Encounter (Signed)
 Called pt. Aware of CT appt details. She voiced understanding.

## 2024-05-23 NOTE — Telephone Encounter (Signed)
-----   Message from Deatrice JULIANNA Dine sent at 05/23/2024  2:22 PM EDT ----- Regarding: CT Hi Gio Janoski/Tamyy,   Can you please schedule a CT abdomen and pelvis with IV contrast? Dx: unintentional weight loss  Thanks,  Muhammad Faizan Ahmed, MD Gastroenterology and Hepatology [facility]

## 2024-05-23 NOTE — Op Note (Signed)
 [facility] Patient Name: Carolyn Boyd Procedure Date: 05/23/2024 1:20 PM MRN: [id number] Date of Birth: 20-Nov-1960 Attending MD: Deatrice Dine , MD, [id number] CSN: [id number] Age: 63 Admit Type: Outpatient Procedure:                Colonoscopy Indications:              Screening for colorectal malignant neoplasm Providers:                Deatrice Dine, MD, Jon LABOR. Gerome RN, RN,                            Jon Loge Referring MD:              Medicines:                Monitored Anesthesia Care Complications:            No immediate complications. Estimated Blood Loss:     Estimated blood loss: none. Procedure:                Pre-Anesthesia Assessment:                           - Prior to the procedure, a History and Physical                            was performed, and patient medications and                            allergies were reviewed. The patient's tolerance of                            previous anesthesia was also reviewed. The risks                            and benefits of the procedure and the sedation                            options and risks were discussed with the patient.                            All questions were answered, and informed consent                            was obtained. Prior Anticoagulants: The patient has                            taken no anticoagulant or antiplatelet agents. ASA                            Grade Assessment: II - A patient with mild systemic                            disease. After reviewing the risks and benefits,  the patient was deemed in satisfactory condition to                            undergo the procedure.                           After obtaining informed consent, the colonoscope                            was passed under direct vision. Throughout the                            procedure, the patient's blood pressure, pulse, and                            oxygen  saturations were monitored continuously. The                            [id number]-[id number] ([id number]) scope was introduced through                            the anus and advanced to the the cecum, identified                            by appendiceal orifice and ileocecal valve. The                            colonoscopy was performed without difficulty. The                            patient tolerated the procedure well. The quality                            of the bowel preparation was evaluated using the                            BBPS [facility] Bowel Preparation Scale) with scores                            of: Right Colon = 3, Transverse Colon = 3 and Left                            Colon = 3 (entire mucosa seen well with no residual                            staining, small fragments of stool or opaque                            liquid). The total BBPS score equals 9. The                            ileocecal valve, appendiceal orifice, and rectum  were photographed. Scope In: 1:42:33 PM Scope Out: 1:58:03 PM Scope Withdrawal Time: 0 hours 12 minutes 55 seconds  Total Procedure Duration: 0 hours 15 minutes 30 seconds  Findings:      The perianal and digital rectal examinations were normal.      A 5 mm polyp was found in the ascending colon. The polyp was sessile.       The polyp was removed with a cold snare. Resection and retrieval were       complete.      Non-bleeding internal hemorrhoids were found during retroflexion. The       hemorrhoids were small. Impression:               - One 5 mm polyp in the ascending colon, removed                            with a cold snare. Resected and retrieved.                           - Non-bleeding internal hemorrhoids. Moderate Sedation:      Per Anesthesia Care Recommendation:           - Patient has a contact number available for                            emergencies. The signs and symptoms of potential                             delayed complications were discussed with the                            patient. Return to normal activities tomorrow.                            Written discharge instructions were provided to the                            patient.                           - Resume previous diet.                           - Patient has a contact number available for                            emergencies. The signs and symptoms of potential                            delayed complications were discussed with the                            patient. Return to normal activities tomorrow.                            Written discharge instructions were provided to the  patient.                           - Resume previous diet.                           - Continue present medications.                           - Await pathology results.                           - Repeat colonoscopy in 7-10 years for surveillance                            based on pathology results.                           - Return to GI office as previously scheduled. Procedure Code(s):        --- Professional ---                           [phone], Colonoscopy, flexible; with removal of                            tumor(s), polyp(s), or other lesion(s) by snare                            technique Diagnosis Code(s):        --- Professional ---                           Z12.11, Encounter for screening for malignant                            neoplasm of colon                           D12.2, Benign neoplasm of ascending colon                           K64.8, Other hemorrhoids CPT copyright 2022 American Medical Association. All rights reserved. The codes documented in this report are preliminary and upon coder review may  be revised to meet current compliance requirements. Deatrice Dine, MD Deatrice Dine, MD 05/23/2024 2:20:53 PM This report has been signed electronically. Number of Addenda: 0

## 2024-05-23 NOTE — Op Note (Signed)
 [facility] Patient Name: Carolyn Boyd Procedure Date: 05/23/2024 1:20 PM MRN: [id number] Date of Birth: [birth year] Attending MD: Deatrice Dine , MD, [id number] CSN: [id number] Age: 63 Admit Type: Outpatient Procedure:                Upper GI endoscopy Indications:              Dyspepsia, Anorexia, Weight loss Providers:                Deatrice Dine, MD, Jon LABOR. Gerome RN, RN,                            Jon Loge Referring MD:              Medicines:                Monitored Anesthesia Care Complications:            No immediate complications. Estimated Blood Loss:     Estimated blood loss was minimal. Procedure:                Pre-Anesthesia Assessment:                           - Prior to the procedure, a History and Physical                            was performed, and patient medications and                            allergies were reviewed. The patient's tolerance of                            previous anesthesia was also reviewed. The risks                            and benefits of the procedure and the sedation                            options and risks were discussed with the patient.                            All questions were answered, and informed consent                            was obtained. Prior Anticoagulants: The patient has                            taken no anticoagulant or antiplatelet agents. ASA                            Grade Assessment: II - A patient with mild systemic                            disease. After reviewing the risks and benefits,  the patient was deemed in satisfactory condition to                            undergo the procedure.                           After obtaining informed consent, the endoscope was                            passed under direct vision. Throughout the                            procedure, the patient's blood pressure, pulse, and                            oxygen  saturations were monitored continuously. The                            [id number]-[id number] ([id number]) scope was introduced through the                            mouth, and advanced to the second part of duodenum.                            The upper GI endoscopy was accomplished without                            difficulty. The patient tolerated the procedure                            well. Scope In: 1:26:44 PM Scope Out: 1:36:48 PM Total Procedure Duration: 0 hours 10 minutes 4 seconds  Findings:      Mucosal changes characterized by nodularity were found at the       gastroesophageal junction. Biopsies were taken with a cold forceps for       histology.      A 3 cm hiatal hernia was present.      Diffuse moderate inflammation characterized by erosions and erythema was       found in the entire examined stomach. Biopsies were taken with a cold       forceps for histology.      The duodenal bulb and second portion of the duodenum were normal. Impression:               - Nodular mucosa in the esophagus. Biopsied.                           - 3 cm hiatal hernia.                           - Gastritis. Biopsied.                           - Normal duodenal bulb and second portion of the  duodenum. Moderate Sedation:      Per Anesthesia Care Recommendation:           - Patient has a contact number available for                            emergencies. The signs and symptoms of potential                            delayed complications were discussed with the                            patient. Return to normal activities tomorrow.                            Written discharge instructions were provided to the                            patient.                           - Resume previous diet.                           - Continue present medications.                           - Await pathology results.                           -Obtain CT Abdomen pelvis for unintentional  weight                            loss Procedure Code(s):        --- Professional ---                           [phone], Esophagogastroduodenoscopy, flexible,                            transoral; with biopsy, single or multiple Diagnosis Code(s):        --- Professional ---                           K22.89, Other specified disease of esophagus                           K44.9, Diaphragmatic hernia without obstruction or                            gangrene                           K29.70, Gastritis, unspecified, without bleeding                           R10.13, Epigastric pain  R63.0, Anorexia                           R63.4, Abnormal weight loss CPT copyright 2022 American Medical Association. All rights reserved. The codes documented in this report are preliminary and upon coder review may  be revised to meet current compliance requirements. Deatrice Dine, MD Deatrice Dine, MD 05/23/2024 2:18:31 PM This report has been signed electronically. Number of Addenda: 0

## 2024-05-23 NOTE — Discharge Instructions (Signed)
  Discharge instructions Please read the instructions outlined below and refer to this sheet in the next few weeks. These discharge instructions provide you with general information on caring for yourself after you leave the hospital. Your doctor may also give you specific instructions. While your treatment has been planned according to the most current medical practices available, unavoidable complications occasionally occur. If you have any problems or questions after discharge, please call your doctor. ACTIVITY You may resume your regular activity but move at a slower pace for the next 24 hours.  Take frequent rest periods for the next 24 hours.  Walking will help expel (get rid of) the air and reduce the bloated feeling in your abdomen.  No driving for 24 hours (because of the anesthesia (medicine) used during the test).  You may shower.  Do not sign any important legal documents or operate any machinery for 24 hours (because of the anesthesia used during the test).  NUTRITION Drink plenty of fluids.  You may resume your normal diet.  Begin with a light meal and progress to your normal diet.  Avoid alcoholic beverages for 24 hours or as instructed by your caregiver.  MEDICATIONS You may resume your normal medications unless your caregiver tells you otherwise.  WHAT YOU CAN EXPECT TODAY You may experience abdominal discomfort such as a feeling of fullness or "gas" pains.  FOLLOW-UP Your doctor will discuss the results of your test with you.  SEEK IMMEDIATE MEDICAL ATTENTION IF ANY OF THE FOLLOWING OCCUR: Excessive nausea (feeling sick to your stomach) and/or vomiting.  Severe abdominal pain and distention (swelling).  Trouble swallowing.  Temperature over 101 F (37.8 C).  Rectal bleeding or vomiting of blood.    Stop using high dose aspirin including Goody/BC powders, NSAIDs such as Aleve, ibuprofen, naproxen, Motrin, Voltaren or Advil (even the topical ones)    I hope you have a  great rest of your week!   Vista Lawman , M.D.. Gastroenterology and Hepatology [facility]

## 2024-05-23 NOTE — Anesthesia Preprocedure Evaluation (Signed)
 Anesthesia Evaluation  Patient identified by MRN, date of birth, ID band Patient awake    Reviewed: Allergy & Precautions, H&P , NPO status , Patient's Chart, lab work & pertinent test results, reviewed documented beta blocker date and time   Airway Mallampati: II  TM Distance: >3 FB Neck ROM: full    Dental no notable dental hx.    Pulmonary neg pulmonary ROS, Current Smoker   Pulmonary exam normal breath sounds clear to auscultation       Cardiovascular Exercise Tolerance: Good hypertension, negative cardio ROS  Rhythm:regular Rate:Normal     Neuro/Psych  Headaches negative neurological ROS  negative psych ROS   GI/Hepatic negative GI ROS, Neg liver ROS,,,  Endo/Other  negative endocrine ROS    Renal/GU Renal diseasenegative Renal ROS  negative genitourinary   Musculoskeletal   Abdominal   Peds  Hematology negative hematology ROS (+)   Anesthesia Other Findings   Reproductive/Obstetrics negative OB ROS                              Anesthesia Physical Anesthesia Plan  ASA: 2  Anesthesia Plan: General   Post-op Pain Management:    Induction:   PONV Risk Score and Plan: Propofol  infusion  Airway Management Planned:   Additional Equipment:   Intra-op Plan:   Post-operative Plan:   Informed Consent: I have reviewed the patients History and Physical, chart, labs and discussed the procedure including the risks, benefits and alternatives for the proposed anesthesia with the patient or authorized representative who has indicated his/her understanding and acceptance.     Dental Advisory Given  Plan Discussed with: CRNA  Anesthesia Plan Comments:         Anesthesia Quick Evaluation

## 2024-05-24 ENCOUNTER — Ambulatory Visit (INDEPENDENT_AMBULATORY_CARE_PROVIDER_SITE_OTHER): Payer: Self-pay | Admitting: Gastroenterology

## 2024-05-24 LAB — SURGICAL PATHOLOGY

## 2024-05-25 ENCOUNTER — Encounter (HOSPITAL_COMMUNITY): Payer: Self-pay | Admitting: Gastroenterology

## 2024-05-25 NOTE — Anesthesia Postprocedure Evaluation (Signed)
 Anesthesia Post Note  Patient: Carolyn Boyd  Procedure(s) Performed: COLONOSCOPY EGD (ESOPHAGOGASTRODUODENOSCOPY)  Patient location during evaluation: Phase II Anesthesia Type: General Level of consciousness: awake Pain management: pain level controlled Vital Signs Assessment: post-procedure vital signs reviewed and stable Respiratory status: spontaneous breathing and respiratory function stable Cardiovascular status: blood pressure returned to baseline and stable Postop Assessment: no headache and no apparent nausea or vomiting Anesthetic complications: no Comments: Late entry   No notable events documented.   Last Vitals:  Vitals:   05/23/24 1131 05/23/24 1403  BP: (!) 143/65 (!) 162/66  Pulse: (!) 58 70  Resp: 17 (!) 23  Temp: 36.6 C (!) 36.4 C  SpO2: 98% 100%    Last Pain:  Vitals:   05/23/24 1403  TempSrc: Axillary  PainSc: 0-No pain                 Yvonna JINNY Bosworth

## 2024-05-29 NOTE — Progress Notes (Signed)
 10 yr TCS noted in recall 1 yr EGD noted in recall Patient result letter mailed procedure note and pathology result faxed to PCP

## 2024-06-14 ENCOUNTER — Ambulatory Visit (HOSPITAL_COMMUNITY)
Admission: RE | Admit: 2024-06-14 | Discharge: 2024-06-14 | Disposition: A | Discharge status: Home / Self Care | Source: Ambulatory Visit | Attending: Gastroenterology | Admitting: Gastroenterology

## 2024-06-14 DIAGNOSIS — R636 Underweight: Secondary | ICD-10-CM | POA: Diagnosis present

## 2024-06-14 DIAGNOSIS — R634 Abnormal weight loss: Secondary | ICD-10-CM | POA: Insufficient documentation

## 2024-06-14 DIAGNOSIS — Z681 Body mass index (BMI) 19 or less, adult: Secondary | ICD-10-CM | POA: Diagnosis present

## 2024-06-14 MED ORDER — IOHEXOL 300 MG/ML  SOLN
100.0000 mL | Freq: Once | INTRAMUSCULAR | Status: AC | PRN
  Administered 2024-06-14: 100 mL via INTRAVENOUS

## 2024-06-14 MED ORDER — IOHEXOL 9 MG/ML PO SOLN
ORAL | Status: AC
  Filled 2024-06-14: qty 1000

## 2024-06-14 MED ORDER — IOHEXOL 9 MG/ML PO SOLN
500.0000 mL | ORAL | Status: AC
  Administered 2024-06-14: 500 mL via ORAL

## 2024-06-26 NOTE — Progress Notes (Signed)
 Hi Tanya ,  Can you please call the patient and tell the patient the CT scan did not report anything concerning to explain weight loss.  This is good news  Thanks,  Massey Ruhland Faizan Halima Fogal, MD Gastroenterology and Hepatology [facility] ============   IMPRESSION: 1. No acute process within the abdomen or pelvis. 2. No etiology for unintentional weight loss identified.    06/2024

## 2024-07-17 ENCOUNTER — Other Ambulatory Visit (HOSPITAL_COMMUNITY): Payer: Self-pay | Admitting: Nurse Practitioner

## 2024-07-17 DIAGNOSIS — M8580 Other specified disorders of bone density and structure, unspecified site: Secondary | ICD-10-CM

## 2024-07-18 ENCOUNTER — Encounter (INDEPENDENT_AMBULATORY_CARE_PROVIDER_SITE_OTHER): Payer: Self-pay | Admitting: Gastroenterology

## 2024-07-24 ENCOUNTER — Other Ambulatory Visit (HOSPITAL_COMMUNITY): Payer: Self-pay | Admitting: Nurse Practitioner

## 2024-07-24 DIAGNOSIS — Z1231 Encounter for screening mammogram for malignant neoplasm of breast: Secondary | ICD-10-CM

## 2024-08-08 ENCOUNTER — Inpatient Hospital Stay
Admission: RE | Admit: 2024-08-08 | Discharge: 2024-08-08 | Disposition: A | Discharge status: Home / Self Care | Payer: Self-pay | Source: Ambulatory Visit | Attending: Nurse Practitioner | Admitting: Nurse Practitioner

## 2024-08-08 ENCOUNTER — Ambulatory Visit (HOSPITAL_COMMUNITY)
Admission: RE | Admit: 2024-08-08 | Discharge: 2024-08-08 | Disposition: A | Discharge status: Home / Self Care | Source: Ambulatory Visit | Attending: Nurse Practitioner | Admitting: Nurse Practitioner

## 2024-08-08 ENCOUNTER — Other Ambulatory Visit (HOSPITAL_COMMUNITY): Payer: Self-pay | Admitting: Nurse Practitioner

## 2024-08-08 ENCOUNTER — Encounter (HOSPITAL_COMMUNITY): Payer: Self-pay

## 2024-08-08 DIAGNOSIS — M8580 Other specified disorders of bone density and structure, unspecified site: Secondary | ICD-10-CM | POA: Insufficient documentation

## 2024-08-08 DIAGNOSIS — Z1231 Encounter for screening mammogram for malignant neoplasm of breast: Secondary | ICD-10-CM

## 2024-09-24 ENCOUNTER — Encounter: Payer: Self-pay | Admitting: Urology

## 2024-09-24 ENCOUNTER — Ambulatory Visit (INDEPENDENT_AMBULATORY_CARE_PROVIDER_SITE_OTHER): Admitting: Urology

## 2024-09-24 VITALS — BP 168/82 | HR 64

## 2024-09-24 DIAGNOSIS — R3129 Other microscopic hematuria: Secondary | ICD-10-CM

## 2024-09-24 LAB — URINALYSIS, ROUTINE W REFLEX MICROSCOPIC
Bilirubin, UA: NEGATIVE
Glucose, UA: NEGATIVE
Ketones, UA: NEGATIVE
Leukocytes,UA: NEGATIVE
Nitrite, UA: NEGATIVE
Protein,UA: NEGATIVE
RBC, UA: NEGATIVE
Specific Gravity, UA: 1.02 (ref 1.005–1.030)
Urobilinogen, Ur: 0.2 mg/dL (ref 0.2–1.0)
pH, UA: 6 (ref 5.0–7.5)

## 2024-09-24 NOTE — Patient Instructions (Signed)
 Blood in the Pee (Hematuria) in Adults: What to Know  Hematuria is blood in the pee. You may be able to see blood in the pee. In some cases, a health care provider may find blood with a test.  Blood in the pee can be caused by infections of the kidney, bladder, or the urethra. The urethra is the tube that drains pee from the bladder.  Other causes may include: Kidney stones. Infection of the prostate. Cancer. Too much calcium in the pee. Conditions that are passed from parent to child. Too much exercise. Infections can be treated with medicine. A kidney stone will usually leave your body when you pee. If infections or kidney stones didn't cause the blood in the urine, then more tests may be needed. It is very important to tell your provider about any blood in your pee, even if you have no pain or the blood stops with no treatment. Blood in the pee can be a sign of a very serious problem, such as cancer. Follow these instructions at home: Medicines Take your medicines only as told. If you were given antibiotics, take them as told. Do not stop taking them even if you start to feel better. Eating and drinking Drink more fluids as told. Aim to drink 3-4 quarts (2.8-3.8 L) a day. Avoid caffeine, tea, and carbonated drinks. These can bother the bladder. Avoid alcohol if a female because it may irritate the prostate. General instructions If you have been diagnosed with a kidney stone, strain your pee to catch the stone if told by your provider. Empty your bladder often. Avoid holding pee for a long time. If you're female, make sure that: You wipe from front to back after using the bathroom. You use each piece of toilet paper only once. You pee before and after sex. It's up to you to get the results of any tests. Ask when your results will be ready and how to get them. You may need to call or meet with your provider to get your results. Keep all follow-up visits. Your provider will need to know  about any changes or any new symptoms. Contact a health care provider if: Your symptoms don't get better after 3 days. Your symptoms get worse. You have back pain or belly pain. You have a fever or chills. You throw up or feel like you may throw up. You throw up every time you take medicine. Get help right away if: You pass blood clots in your pee. You pass out. These symptoms may be an emergency. Call 911 right away. Do not wait to see if the symptoms will go away. Do not drive yourself to the hospital. This information is not intended to replace advice given to you by your health care provider. Make sure you discuss any questions you have with your health care provider. Document Revised: 08/18/2023 Document Reviewed: 07/28/2023 Elsevier Patient Education  2024 [employer].

## 2024-09-24 NOTE — Progress Notes (Signed)
 09/24/2024 2:21 PM   Carolyn Boyd [birth year] [id number]  Referring provider: Joeann Browning, FNP [address] [place],  [place] [address]  microhematuria   HPI: Carolyn Boyd is a 63yo here for evaluation of microhematuria. She has 2 UAs at [facility] office which were positive for blood. UA today shows no RBCs. CT 06/14/24 showed no GU abnormalities. [name] has a hx of nephrolithiasis but no recent stone passage. She worked in a [employer] previously. No dysuria or gross hematuria.    PMH: Past Medical History:  Diagnosis Date   IBS (irritable bowel syndrome)    Kidney stones    Kidney stones    Migraine     Surgical History: Past Surgical History:  Procedure Laterality Date   ABDOMINAL HYSTERECTOMY     CHOLECYSTECTOMY     COLONOSCOPY N/A 05/23/2024   Procedure: COLONOSCOPY;  Surgeon: Cinderella Deatrice JULIANNA, MD;  [facility];  Service: Endoscopy;  Laterality: N/A;  130PM, ASA 1-2   ESOPHAGOGASTRODUODENOSCOPY N/A 05/23/2024   Procedure: EGD (ESOPHAGOGASTRODUODENOSCOPY);  Surgeon: Cinderella Deatrice JULIANNA, MD;  [facility];  Service: Endoscopy;  Laterality: N/A;   EYE SURGERY      Home Medications:  Allergies as of 09/24/2024       Reactions   Elemental Sulfur Swelling   Penicillins Hives        Medication List        Accurate as of September 24, 2024  2:21 PM. If you have any questions, ask your nurse or doctor.          Biotin 1000 MCG tablet Take by mouth.   escitalopram 5 MG tablet Commonly known as: LEXAPRO Take 5 mg by mouth daily.   estrogens (conjugated) 0.625 MG tablet Commonly known as: PREMARIN Take by mouth.   HYDROcodone -acetaminophen  5-325 MG tablet Commonly known as: NORCO/VICODIN Take 1 tablet by mouth daily as needed.   multivitamin with minerals Tabs tablet Take 1 tablet by mouth daily.   promethazine  25 MG tablet Commonly known as: PHENERGAN  Take 1 tablet (25 mg total) by mouth every 6 (six) hours as needed for  nausea.   traZODone 50 MG tablet Commonly known as: DESYREL        Allergies:  Allergies  Allergen Reactions   Elemental Sulfur Swelling   Penicillins Hives    Family History: Family History  Problem Relation Age of Onset   Colon cancer Maternal Grandmother     Social History:  reports that she has been smoking cigarettes. She has never used smokeless tobacco. She reports that she does not drink alcohol and does not use drugs.  ROS: All other review of systems were reviewed and are negative except what is noted above in HPI  Physical Exam: BP (!) 168/82   Pulse 64   Constitutional:  Alert and oriented, No acute distress. HEENT:  AT, moist mucus membranes.  Trachea midline, no masses. Cardiovascular: No clubbing, cyanosis, or edema. Respiratory: Normal respiratory effort, no increased work of breathing. GI: Abdomen is soft, nontender, nondistended, no abdominal masses GU: No CVA tenderness.  Lymph: No cervical or inguinal lymphadenopathy. Skin: No rashes, bruises or suspicious lesions. Neurologic: Grossly intact, no focal deficits, moving all 4 extremities. Psychiatric: Normal mood and affect.  Laboratory Data: Lab Results  Component Value Date   WBC 5.9 05/04/2012   HGB 14.6 05/04/2012   HCT 44.0 05/04/2012   MCV 91.1 05/04/2012   PLT 132 (L) 05/04/2012    Lab  Results  Component Value Date   CREATININE 0.80 10/26/2021    No results found for: PSA  No results found for: TESTOSTERONE  No results found for: HGBA1C  Urinalysis    Component Value Date/Time   COLORURINE YELLOW 10/31/2012 1120   APPEARANCEUR CLEAR 10/31/2012 1120   LABSPEC 1.025 10/31/2012 1120   PHURINE 6.0 10/31/2012 1120   GLUCOSEU NEGATIVE 10/31/2012 1120   HGBUR LARGE (A) 10/31/2012 1120   BILIRUBINUR NEGATIVE 10/31/2012 1120   KETONESUR NEGATIVE 10/31/2012 1120   PROTEINUR NEGATIVE 10/31/2012 1120   UROBILINOGEN 0.2 10/31/2012 1120   NITRITE NEGATIVE 10/31/2012 1120    LEUKOCYTESUR NEGATIVE 10/31/2012 1120    No results found for: LABMICR, WBCUA, RBCUA, LABEPIT, MUCUS, BACTERIA  Pertinent Imaging: Ct 06/14/2024: Images reviewed and discussed with the patient  Results for orders placed during the hospital encounter of 10/31/12  DG Abd 1 View  Narrative *RADIOLOGY REPORT*  Clinical Data: Right flank pain question kidney stones, past history of kidney stones  ABDOMEN - 1 VIEW  Comparison: 05/04/2012  Findings: Bowel gas pattern normal. Surgical clips right upper quadrant question cholecystectomy. Single calcification 6 x 4 mm diameter projects over the right kidney. Tiny 2 mm calcification in the right pelvis, corresponding to a phlebolith on prior CT. No definite ureteral calcification is identified. Bones unremarkable.  IMPRESSION: 6 x 4 mm nonobstructing right renal calculus. No definite ureteral calcification is visualized.   Original Report Authenticated By: Oneil Kiss, M.D.  No results found for this or any previous visit.  No results found for this or any previous visit.  No results found for this or any previous visit.  No results found for this or any previous visit.  No results found for this or any previous visit.  No results found for this or any previous visit.  No results found for this or any previous visit.   Assessment & Plan:    1. Microscopic hematuria (Primary) Urine cytology, will call with results -followup 6 months with a UA - Urinalysis, Routine w reflex microscopic   No follow-ups on file.  [name], MD  [facility] [place]

## 2024-09-25 LAB — CYTOLOGY, URINE

## 2024-09-28 ENCOUNTER — Ambulatory Visit: Payer: Self-pay

## 2024-09-28 NOTE — Telephone Encounter (Signed)
 Letter sent

## 2024-11-12 ENCOUNTER — Emergency Department (HOSPITAL_COMMUNITY)

## 2024-11-12 ENCOUNTER — Other Ambulatory Visit: Payer: Self-pay

## 2024-11-12 ENCOUNTER — Observation Stay (HOSPITAL_COMMUNITY)
Admission: EM | Admit: 2024-11-12 | Discharge: 2024-11-13 | Discharge status: Against Medical Advice | Attending: Internal Medicine | Admitting: Internal Medicine

## 2024-11-12 ENCOUNTER — Encounter (HOSPITAL_COMMUNITY): Payer: Self-pay | Admitting: *Deleted

## 2024-11-12 DIAGNOSIS — K219 Gastro-esophageal reflux disease without esophagitis: Secondary | ICD-10-CM | POA: Insufficient documentation

## 2024-11-12 DIAGNOSIS — F17210 Nicotine dependence, cigarettes, uncomplicated: Secondary | ICD-10-CM | POA: Insufficient documentation

## 2024-11-12 DIAGNOSIS — R636 Underweight: Secondary | ICD-10-CM | POA: Insufficient documentation

## 2024-11-12 DIAGNOSIS — R112 Nausea with vomiting, unspecified: Principal | ICD-10-CM | POA: Insufficient documentation

## 2024-11-12 DIAGNOSIS — R627 Adult failure to thrive: Secondary | ICD-10-CM

## 2024-11-12 DIAGNOSIS — K5900 Constipation, unspecified: Secondary | ICD-10-CM | POA: Insufficient documentation

## 2024-11-12 DIAGNOSIS — Z681 Body mass index (BMI) 19 or less, adult: Secondary | ICD-10-CM | POA: Insufficient documentation

## 2024-11-12 DIAGNOSIS — R109 Unspecified abdominal pain: Secondary | ICD-10-CM | POA: Diagnosis not present

## 2024-11-12 DIAGNOSIS — Z79899 Other long term (current) drug therapy: Secondary | ICD-10-CM | POA: Diagnosis not present

## 2024-11-12 LAB — CBC
HCT: 44.6 % (ref 36.0–46.0)
Hemoglobin: 14.8 g/dL (ref 12.0–15.0)
MCH: 31 pg (ref 26.0–34.0)
MCHC: 33.2 g/dL (ref 30.0–36.0)
MCV: 93.5 fL (ref 80.0–100.0)
Platelets: 184 K/uL (ref 150–400)
RBC: 4.77 MIL/uL (ref 3.87–5.11)
RDW: 13.5 % (ref 11.5–15.5)
WBC: 9.5 K/uL (ref 4.0–10.5)
nRBC: 0 % (ref 0.0–0.2)

## 2024-11-12 LAB — URINALYSIS, ROUTINE W REFLEX MICROSCOPIC
Bilirubin Urine: NEGATIVE
Glucose, UA: NEGATIVE mg/dL
Hgb urine dipstick: NEGATIVE
Ketones, ur: 20 mg/dL — AB
Leukocytes,Ua: NEGATIVE
Nitrite: NEGATIVE
Protein, ur: NEGATIVE mg/dL
Specific Gravity, Urine: 1.012 (ref 1.005–1.030)
pH: 7 (ref 5.0–8.0)

## 2024-11-12 LAB — COMPREHENSIVE METABOLIC PANEL WITH GFR
ALT: 21 U/L (ref 0–44)
AST: 24 U/L (ref 15–41)
Albumin: 4.6 g/dL (ref 3.5–5.0)
Alkaline Phosphatase: 66 U/L (ref 38–126)
Anion gap: 19 — ABNORMAL HIGH (ref 5–15)
BUN: 11 mg/dL (ref 8–23)
CO2: 18 mmol/L — ABNORMAL LOW (ref 22–32)
Calcium: 10 mg/dL (ref 8.9–10.3)
Chloride: 103 mmol/L (ref 98–111)
Creatinine, Ser: 0.63 mg/dL (ref 0.44–1.00)
GFR, Estimated: >60 mL/min
Glucose, Bld: 129 mg/dL — ABNORMAL HIGH (ref 70–99)
Potassium: 3.7 mmol/L (ref 3.5–5.1)
Sodium: 140 mmol/L (ref 135–145)
Total Bilirubin: 0.3 mg/dL (ref 0.0–1.2)
Total Protein: 7 g/dL (ref 6.5–8.1)

## 2024-11-12 LAB — LIPASE, BLOOD: Lipase: 17 U/L (ref 11–51)

## 2024-11-12 MED ORDER — PANTOPRAZOLE SODIUM 40 MG IV SOLR
40.0000 mg | Freq: Once | INTRAVENOUS | Status: AC
  Administered 2024-11-12: 40 mg via INTRAVENOUS
  Filled 2024-11-12: qty 10

## 2024-11-12 MED ORDER — IOHEXOL 300 MG/ML  SOLN
100.0000 mL | Freq: Once | INTRAMUSCULAR | Status: AC | PRN
  Administered 2024-11-12: 100 mL via INTRAVENOUS

## 2024-11-12 MED ORDER — ALUM & MAG HYDROXIDE-SIMETH 200-200-20 MG/5ML PO SUSP
30.0000 mL | Freq: Once | ORAL | Status: AC
  Administered 2024-11-12: 30 mL via ORAL
  Filled 2024-11-12: qty 30

## 2024-11-12 MED ORDER — METOCLOPRAMIDE HCL 5 MG/ML IJ SOLN
10.0000 mg | Freq: Once | INTRAMUSCULAR | Status: AC
  Administered 2024-11-12: 10 mg via INTRAVENOUS
  Filled 2024-11-12: qty 2

## 2024-11-12 MED ORDER — MORPHINE SULFATE (PF) 4 MG/ML IV SOLN
4.0000 mg | Freq: Once | INTRAVENOUS | Status: AC
  Administered 2024-11-12: 4 mg via INTRAVENOUS
  Filled 2024-11-12: qty 1

## 2024-11-12 MED ORDER — DIPHENHYDRAMINE HCL 50 MG/ML IJ SOLN
25.0000 mg | Freq: Once | INTRAMUSCULAR | Status: AC
  Administered 2024-11-12: 25 mg via INTRAVENOUS
  Filled 2024-11-12: qty 1

## 2024-11-12 MED ORDER — ONDANSETRON 4 MG PO TBDP
4.0000 mg | ORAL_TABLET | Freq: Once | ORAL | Status: DC
  Filled 2024-11-12: qty 1

## 2024-11-12 MED ORDER — ONDANSETRON HCL 4 MG/2ML IJ SOLN
4.0000 mg | Freq: Once | INTRAMUSCULAR | Status: AC
  Administered 2024-11-12: 4 mg via INTRAVENOUS
  Filled 2024-11-12: qty 2

## 2024-11-12 MED ORDER — MORPHINE SULFATE (PF) 4 MG/ML IV SOLN
2.0000 mg | Freq: Once | INTRAVENOUS | Status: AC
  Administered 2024-11-12: 2 mg via INTRAVENOUS
  Filled 2024-11-12: qty 1

## 2024-11-12 MED ORDER — SODIUM CHLORIDE 0.9 % IV BOLUS
1000.0000 mL | Freq: Once | INTRAVENOUS | Status: AC
  Administered 2024-11-12: 1000 mL via INTRAVENOUS

## 2024-11-12 NOTE — ED Triage Notes (Addendum)
 Pt with emesis x 3 days. C/o burning to her stomach, pt tachypnea in triage due to the pain per pt. Has tried Zofran  with no relief per pt

## 2024-11-12 NOTE — ED Provider Notes (Signed)
 " [facility] AT [facility] Provider Note   CSN: [id number] Arrival date & time: 11/12/24  1424     Patient presents with: Abdominal Pain   CANDRA WEGNER is a 63 y.o. female.   Patient is a 63 year old female who to the emergency department with a chief complaint of generalized abdominal pain, nausea, vomiting for approximate the past 3 days.  Patient notes that she has had decreased urine output.  She notes that she has not had a bowel movement over the past 3 days as well.  Patient does have a history of hysterectomy and cholecystectomy.  She denies any fever or chills.  She has had no chest pain or shortness of breath.   Abdominal Pain      Prior to Admission medications  Medication Sig Start Date End Date Taking? Authorizing Provider  Biotin 1000 MCG tablet Take by mouth.    [provider]  escitalopram (LEXAPRO) 5 MG tablet Take 5 mg by mouth daily. 02/15/24   [provider]  estrogens, conjugated, (PREMARIN) 0.625 MG tablet Take by mouth.    [provider]  HYDROcodone -acetaminophen  (NORCO/VICODIN) 5-325 MG tablet Take 1 tablet by mouth daily as needed. 09/26/20   [provider]  Multiple Vitamin (MULITIVITAMIN WITH MINERALS) TABS Take 1 tablet by mouth daily.    [provider]  promethazine  (PHENERGAN ) 25 MG tablet Take 1 tablet (25 mg total) by mouth every 6 (six) hours as needed for nausea. 10/31/12   Bluford Rogue, MD  traZODone (DESYREL) 50 MG tablet  08/26/20   [provider]  dicyclomine (BENTYL) 10 MG capsule Take 10 mg by mouth 4 (four) times daily -  before meals and at bedtime.    01/31/12  [provider]  esomeprazole (NEXIUM) 40 MG capsule Take 40 mg by mouth daily before breakfast.    01/31/12  [provider]    Allergies: Elemental sulfur and Penicillins    Review of Systems  Gastrointestinal:  Positive for abdominal pain.  All other systems reviewed and  are negative.   Updated Vital Signs BP (!) 152/88 (BP Location: Right Arm)   Pulse (!) 54   Temp 97.8 F (36.6 C) (Temporal)   Resp (!) 36   Ht 5' 7 (1.702 m)   Wt 45.4 kg   SpO2 100%   BMI 15.66 kg/m   Physical Exam Vitals and nursing note reviewed.  Constitutional:      General: She is not in acute distress.    Appearance: Normal appearance. She is not ill-appearing.  HENT:     Head: Normocephalic and atraumatic.     Nose: Nose normal.     Mouth/Throat:     Mouth: Mucous membranes are moist.  Eyes:     Extraocular Movements: Extraocular movements intact.     Conjunctiva/sclera: Conjunctivae normal.     Pupils: Pupils are equal, round, and reactive to light.  Cardiovascular:     Rate and Rhythm: Normal rate and regular rhythm.     Pulses: Normal pulses.     Heart sounds: Normal heart sounds. No murmur heard.    No gallop.  Pulmonary:     Effort: Pulmonary effort is normal. No respiratory distress.     Breath sounds: Normal breath sounds. No stridor. No wheezing, rhonchi or rales.  Abdominal:     General: Abdomen is flat. Bowel sounds are decreased. There is no distension. There are no signs of injury.  Palpations: Abdomen is soft.     Tenderness: There is generalized abdominal tenderness.     Hernia: No hernia is present.  Musculoskeletal:        General: Normal range of motion.     Cervical back: Normal range of motion and neck supple.  Skin:    General: Skin is warm and dry.  Neurological:     General: No focal deficit present.     Mental Status: She is alert and oriented to person, place, and time. Mental status is at baseline.  Psychiatric:        Mood and Affect: Mood normal.        Behavior: Behavior normal.        Thought Content: Thought content normal.        Judgment: Judgment normal.     (all labs ordered are listed, but only abnormal results are displayed) Labs Reviewed  COMPREHENSIVE METABOLIC PANEL WITH GFR - Abnormal; Notable for the  following components:      Result Value   CO2 18 (*)    Glucose, Bld 129 (*)    Anion gap 19 (*)    All other components within normal limits  LIPASE, BLOOD  CBC  URINALYSIS, ROUTINE W REFLEX MICROSCOPIC    EKG: None  Radiology: No results found.   Procedures   Medications Ordered in the ED  ondansetron  (ZOFRAN -ODT) disintegrating tablet 4 mg (has no administration in time range)  alum & mag hydroxide-simeth (MAALOX/MYLANTA) 200-200-20 MG/5ML suspension 30 mL (has no administration in time range)  metoCLOPramide  (REGLAN ) injection 10 mg (has no administration in time range)  diphenhydrAMINE  (BENADRYL ) injection 25 mg (has no administration in time range)  morphine  (PF) 4 MG/ML injection 4 mg (has no administration in time range)  sodium chloride  0.9 % bolus 1,000 mL (has no administration in time range)                                    Medical Decision Making Amount and/or Complexity of Data Reviewed Labs: ordered.  Risk Prescription drug management. Decision regarding hospitalization.   This patient presents to the ED for concern of abdominal pain, nausea, vomiting, this involves an extensive number of treatment options, and is a complaint that carries with it a high risk of complications and morbidity.  The differential diagnosis includes gastritis, gastroenteritis, acute appendicitis, cholecystitis, some bowel obstruction, diverticulitis, ovarian torsion or cyst, PID, tubo-ovarian abscess, pyelonephritis, kidney stone, pancreatitis, mesenteric ischemia   Co morbidities that complicate the patient evaluation  IBS   Additional history obtained:  Additional history obtained from none External records from outside source obtained and reviewed including none   Lab Tests:  I Ordered, and personally interpreted labs.  The pertinent results include: No leukocytosis, no anemia, normal kidney function liver function, unremarkable electrolytes, unremarkable  urinalysis   Imaging Studies ordered:  I ordered imaging studies including CT scan abdomen and pelvis I independently visualized and interpreted imaging which showed no acute intra-abdominal surgical process I agree with the radiologist interpretation    Consultations Obtained:  I requested consultation with the hospitalist,  and discussed lab and imaging findings as well as pertinent plan - they recommend: Admission   Problem List / ED Course / Critical interventions / Medication management  Patient does remain stable at this point but continues to have ongoing abdominal pain, nausea and vomiting despite multiple medications in the emergency department.  CT scan of the abdomen and pelvis demonstrated no signs of acute intra-abdominal surgical process.  Low suspicion for mesenteric ischemia at this time.  Do not suspect ovarian torsion or cyst given the unremarkable CT scan and no masses noted.  Given her ongoing symptoms we will plan for admission to the hospital.  Have discussed patient case with Dr. Adefeso with the hospital service who has excepted for admission.  Patient has been fully evaluated by attending physician who is in agreement to plan at this time. I ordered medication including morphine , Zofran , Reglan , Benadryl , Protonix , GI cocktail for abdominal pain Reevaluation of the patient after these medicines showed that the patient improved I have reviewed the patients home medicines and have made adjustments as needed   Social Determinants of Health:  None   Test / Admission - Considered:  Admission     Final diagnoses:  None    ED Discharge Orders     None          Daralene Lonni BIRCH, PA-C 11/12/24 2306    Gennaro Duwaine CROME, DO 11/27/24 0848  "

## 2024-11-12 NOTE — H&P (Signed)
 " History and Physical    Patient: Carolyn Boyd FMW:[id number] DOB: Jul 01, 1961 DOA: 11/12/2024 DOS: the patient was seen and examined on 11/12/2024 PCP: Shona Norleen PEDLAR, MD  Patient coming from: {Point_of_Origin:[address]}  Chief Complaint:  Chief Complaint  Patient presents with   Abdominal Pain   HPI: Carolyn Boyd is a 63 y.o. female with medical history significant of ***  Review of Systems: {ROS_Text:26778} Past Medical History:  Diagnosis Date   IBS (irritable bowel syndrome)    Kidney stones    Kidney stones    Migraine    Past Surgical History:  Procedure Laterality Date   ABDOMINAL HYSTERECTOMY     CHOLECYSTECTOMY     COLONOSCOPY N/A 05/23/2024   Procedure: COLONOSCOPY;  Surgeon: Cinderella Deatrice FALCON, MD;  [facility];  Service: Endoscopy;  Laterality: N/A;  130PM, ASA 1-2   ESOPHAGOGASTRODUODENOSCOPY N/A 05/23/2024   Procedure: EGD (ESOPHAGOGASTRODUODENOSCOPY);  Surgeon: Cinderella Deatrice FALCON, MD;  [facility];  Service: Endoscopy;  Laterality: N/A;   EYE SURGERY     Social History:  reports that she has been smoking cigarettes. She has never used smokeless tobacco. She reports that she does not drink alcohol and does not use drugs.  Allergies[1]  Family History  Problem Relation Age of Onset   Colon cancer Maternal Grandmother     Prior to Admission medications  Medication Sig Start Date End Date Taking? Authorizing Provider  Biotin 1000 MCG tablet Take by mouth.    [provider]  escitalopram (LEXAPRO) 5 MG tablet Take 5 mg by mouth daily. 02/15/24   [provider]  estrogens, conjugated, (PREMARIN) 0.625 MG tablet Take by mouth.    [provider]  HYDROcodone -acetaminophen  (NORCO/VICODIN) 5-325 MG tablet Take 1 tablet by mouth daily as needed. 09/26/20   [provider]  Multiple Vitamin (MULITIVITAMIN WITH MINERALS) TABS Take 1 tablet by mouth daily.    [provider]  promethazine  (PHENERGAN ) 25 MG  tablet Take 1 tablet (25 mg total) by mouth every 6 (six) hours as needed for nausea. 10/31/12   Bluford Rogue, MD  traZODone (DESYREL) 50 MG tablet  08/26/20   [provider]  dicyclomine (BENTYL) 10 MG capsule Take 10 mg by mouth 4 (four) times daily -  before meals and at bedtime.    01/31/12  [provider]  esomeprazole (NEXIUM) 40 MG capsule Take 40 mg by mouth daily before breakfast.    01/31/12  [provider]    Physical Exam: Vitals:   11/12/24 2200 11/12/24 2215 11/12/24 2230 11/12/24 2245  BP: (!) 157/73 139/88 (!) 151/73 (!) 156/72  Pulse: (!) 59 (!) 58 (!) 58 (!) 58  Resp: 20 18 20 15   Temp:      TempSrc:      SpO2: 95% 96% 96% 93%  Weight:      Height:       *** Data Reviewed: {Tip this will not be part of the note when signed- Document your independent interpretation of telemetry tracing, EKG, lab, Radiology test or any other diagnostic tests. Add any new diagnostic test ordered today. (Optional):26781} {Results:26384}  Assessment and Plan: No notes have been filed under this hospital service. Service: Hospitalist     Advance Care Planning:   Code Status: Not on file ***  Consults: ***  Family Communication: ***  Severity of Illness: {Observation/Inpatient:21159}  Author: Posey Maier, DO 11/12/2024 11:05 PM  For on call review [email or web address].     [  1]  Allergies Allergen Reactions   Elemental Sulfur Swelling   Penicillins Hives   "

## 2024-11-12 NOTE — H&P (Incomplete)
 " History and Physical    Patient: Carolyn Boyd FMW:[id number] DOB: [birth year] DOA: 11/12/2024 DOS: the patient was seen and examined on 11/12/2024 PCP: Shona Norleen PEDLAR, MD  Patient coming from: Home  Chief Complaint:  Chief Complaint  Patient presents with   Abdominal Pain   HPI: NOU CHARD is a 63 y.o. female with medical history significant of IBS, GERD with 3-day onset of nausea, vomiting and generalized abdominal pain.  Vomitus was nonbloody and was up to 10-15 episodes daily with difficulty being able to keep any food down   Review of Systems: {ROS_Text:26778} Past Medical History:  Diagnosis Date   IBS (irritable bowel syndrome)    Kidney stones    Kidney stones    Migraine    Past Surgical History:  Procedure Laterality Date   ABDOMINAL HYSTERECTOMY     CHOLECYSTECTOMY     COLONOSCOPY N/A 05/23/2024   Procedure: COLONOSCOPY;  Surgeon: Cinderella Deatrice FALCON, MD;  [facility];  Service: Endoscopy;  Laterality: N/A;  130PM, ASA 1-2   ESOPHAGOGASTRODUODENOSCOPY N/A 05/23/2024   Procedure: EGD (ESOPHAGOGASTRODUODENOSCOPY);  Surgeon: Cinderella Deatrice FALCON, MD;  [facility];  Service: Endoscopy;  Laterality: N/A;   EYE SURGERY     Social History:  reports that she has been smoking cigarettes. She has never used smokeless tobacco. She reports that she does not drink alcohol and does not use drugs.  Allergies[1]  Family History  Problem Relation Age of Onset   Colon cancer Maternal Grandmother     Prior to Admission medications  Medication Sig Start Date End Date Taking? Authorizing Provider  Biotin 1000 MCG tablet Take by mouth.    [provider]  escitalopram (LEXAPRO) 5 MG tablet Take 5 mg by mouth daily. 02/15/24   [provider]  estrogens, conjugated, (PREMARIN) 0.625 MG tablet Take by mouth.    [provider]  HYDROcodone -acetaminophen  (NORCO/VICODIN) 5-325 MG tablet Take 1 tablet by mouth daily as needed.  09/26/20   [provider]  Multiple Vitamin (MULITIVITAMIN WITH MINERALS) TABS Take 1 tablet by mouth daily.    [provider]  promethazine  (PHENERGAN ) 25 MG tablet Take 1 tablet (25 mg total) by mouth every 6 (six) hours as needed for nausea. 10/31/12   Bluford Rogue, MD  traZODone (DESYREL) 50 MG tablet  08/26/20   [provider]  dicyclomine (BENTYL) 10 MG capsule Take 10 mg by mouth 4 (four) times daily -  before meals and at bedtime.    01/31/12  [provider]  esomeprazole (NEXIUM) 40 MG capsule Take 40 mg by mouth daily before breakfast.    01/31/12  [provider]    Physical Exam: Vitals:   11/12/24 2200 11/12/24 2215 11/12/24 2230 11/12/24 2245  BP: (!) 157/73 139/88 (!) 151/73 (!) 156/72  Pulse: (!) 59 (!) 58 (!) 58 (!) 58  Resp: 20 18 20 15   Temp:      TempSrc:      SpO2: 95% 96% 96% 93%  Weight:      Height:       *** Data Reviewed: {Tip this will not be part of the note when signed- Document your independent interpretation of telemetry tracing, EKG, lab, Radiology test or any other diagnostic tests. Add any new diagnostic test ordered today. (Optional):26781} {Results:26384}  Assessment and Plan: No notes have been filed under this hospital service. Service: Hospitalist     Advance Care Planning:   Code Status: Not on  file ***  Consults: ***  Family Communication: ***  Severity of Illness: {Observation/Inpatient:21159}  Author: Posey Maier, DO 11/12/2024 11:05 PM  For on call review [email or web address].        [1] Allergies Allergen Reactions   Elemental Sulfur Swelling   Penicillins Hives  "

## 2024-11-13 LAB — COMPREHENSIVE METABOLIC PANEL WITH GFR
ALT: 18 U/L (ref 0–44)
AST: 21 U/L (ref 15–41)
Albumin: 4.2 g/dL (ref 3.5–5.0)
Alkaline Phosphatase: 58 U/L (ref 38–126)
Anion gap: 9 (ref 5–15)
BUN: 9 mg/dL (ref 8–23)
CO2: 26 mmol/L (ref 22–32)
Calcium: 8.3 mg/dL — ABNORMAL LOW (ref 8.9–10.3)
Chloride: 105 mmol/L (ref 98–111)
Creatinine, Ser: 0.59 mg/dL (ref 0.44–1.00)
GFR, Estimated: >60 mL/min
Glucose, Bld: 99 mg/dL (ref 70–99)
Potassium: 3.7 mmol/L (ref 3.5–5.1)
Sodium: 140 mmol/L (ref 135–145)
Total Bilirubin: 0.4 mg/dL (ref 0.0–1.2)
Total Protein: 6.2 g/dL — ABNORMAL LOW (ref 6.5–8.1)

## 2024-11-13 LAB — CBC
HCT: 41 % (ref 36.0–46.0)
Hemoglobin: 13.9 g/dL (ref 12.0–15.0)
MCH: 31.2 pg (ref 26.0–34.0)
MCHC: 33.9 g/dL (ref 30.0–36.0)
MCV: 92.1 fL (ref 80.0–100.0)
Platelets: 144 K/uL — ABNORMAL LOW (ref 150–400)
RBC: 4.45 MIL/uL (ref 3.87–5.11)
RDW: 13.5 % (ref 11.5–15.5)
WBC: 10.4 K/uL (ref 4.0–10.5)
nRBC: 0 % (ref 0.0–0.2)

## 2024-11-13 LAB — MAGNESIUM: Magnesium: 2.1 mg/dL (ref 1.7–2.4)

## 2024-11-13 LAB — PHOSPHORUS: Phosphorus: 3.2 mg/dL (ref 2.5–4.6)

## 2024-11-13 LAB — HIV ANTIBODY (ROUTINE TESTING W REFLEX): HIV Screen 4th Generation wRfx: NONREACTIVE

## 2024-11-13 MED ORDER — ENOXAPARIN SODIUM 40 MG/0.4ML IJ SOSY: 40.0000 mg | PREFILLED_SYRINGE | INTRAMUSCULAR | Status: DC

## 2024-11-13 MED ORDER — MORPHINE SULFATE (PF) 2 MG/ML IV SOLN
2.0000 mg | INTRAVENOUS | Status: DC | PRN
  Administered 2024-11-13: 2 mg via INTRAVENOUS
  Filled 2024-11-13: qty 1

## 2024-11-13 MED ORDER — ACETAMINOPHEN 650 MG RE SUPP: 650.0000 mg | Freq: Four times a day (QID) | RECTAL | Status: DC | PRN

## 2024-11-13 MED ORDER — DOCUSATE SODIUM 100 MG PO CAPS: 100.0000 mg | ORAL_CAPSULE | Freq: Every day | ORAL | Status: DC | PRN

## 2024-11-13 MED ORDER — PANTOPRAZOLE SODIUM 40 MG IV SOLR
40.0000 mg | INTRAVENOUS | Status: DC
  Administered 2024-11-13: 40 mg via INTRAVENOUS
  Filled 2024-11-13: qty 10

## 2024-11-13 MED ORDER — ENSURE PLUS HIGH PROTEIN PO LIQD
237.0000 mL | Freq: Two times a day (BID) | ORAL | Status: DC
  Filled 2024-11-13 (×4): qty 237

## 2024-11-13 MED ORDER — LACTATED RINGERS IV SOLN: INTRAVENOUS | Status: DC

## 2024-11-13 MED ORDER — POLYETHYLENE GLYCOL 3350 17 G PO PACK: 17.0000 g | PACK | Freq: Every day | ORAL | Status: DC

## 2024-11-13 MED ORDER — ONDANSETRON HCL 4 MG PO TABS: 4.0000 mg | ORAL_TABLET | Freq: Four times a day (QID) | ORAL | Status: DC | PRN

## 2024-11-13 MED ORDER — ACETAMINOPHEN 325 MG PO TABS: 650.0000 mg | ORAL_TABLET | Freq: Four times a day (QID) | ORAL | Status: DC | PRN

## 2024-11-13 MED ORDER — ONDANSETRON HCL 4 MG/2ML IJ SOLN: 4.0000 mg | Freq: Four times a day (QID) | INTRAMUSCULAR | Status: DC | PRN

## 2024-11-13 NOTE — ED Notes (Addendum)
 Dr. Janese that pt will like to leave AMA.

## 2024-11-13 NOTE — ED Notes (Signed)
 Pt ambulatory to restroom

## 2024-11-13 NOTE — ED Notes (Signed)
Ginger ale provided to patient. 

## 2024-11-13 NOTE — Discharge Summary (Signed)
 Physician Discharge Summary  Carolyn Boyd FMW:[id number] DOB: 03/30/1961 DOA: 11/12/2024  PCP: Shona Norleen PEDLAR, MD  Admit date: 11/12/2024 Discharge date: 11/13/2024  Admitted From: Hom Disposition:  AMA  Recommendations for Outpatient Follow-up:  Left AMA prior to my assessment  Patient left AMA prior to my assessment.  I did not get a chance to see this patient.  According to the nursing staff she was alert awake oriented X4   Discharge Exam: Vitals:   11/13/24 0545 11/13/24 0749  BP: (!) 153/74 (!) 153/78  Pulse: (!) 50 (!) 52  Resp:  18  Temp:  98.5 F (36.9 C)  SpO2: 94% 91%   Vitals:   11/13/24 0430 11/13/24 0500 11/13/24 0545 11/13/24 0749  BP: 128/74 124/72 (!) 153/74 (!) 153/78  Pulse: (!) 53 (!) 57 (!) 50 (!) 52  Resp:    18  Temp:    98.5 F (36.9 C)  TempSrc:    Oral  SpO2: 91% 92% 94% 91%  Weight:      Height:          Discharge Instructions     Allergies[1]  You were cared for by a hospitalist during your hospital stay. If you have any questions about your discharge medications or the care you received while you were in the hospital after you are discharged, you can call the unit and asked to speak with the hospitalist on call if the hospitalist that took care of you is not available. Once you are discharged, your primary care physician will handle any further medical issues. Please note that no refills for any discharge medications will be authorized once you are discharged, as it is imperative that you return to your primary care physician (or establish a relationship with a primary care physician if you do not have one) for your aftercare needs so that they can reassess your need for medications and monitor your lab values.  You were cared for by a hospitalist during your hospital stay. If you have any questions about your discharge medications or the care you received while you were in the hospital after you are discharged, you can call the unit and  asked to speak with the hospitalist on call if the hospitalist that took care of you is not available. Once you are discharged, your primary care physician will handle any further medical issues. Please note that NO REFILLS for any discharge medications will be authorized once you are discharged, as it is imperative that you return to your primary care physician (or establish a relationship with a primary care physician if you do not have one) for your aftercare needs so that they can reassess your need for medications and monitor your lab values.  Please request your Prim.MD to go over all Hospital Tests and Procedure/Radiological results at the follow up, please get all Hospital records sent to your Prim MD by signing hospital release before you go home.  Get CBC, CMP, 2 view Chest X ray checked  by Primary MD during your next visit or SNF MD in 5-7 days ( we routinely change or add medications that can affect your baseline labs and fluid status, therefore we recommend that you get the mentioned basic workup next visit with your PCP, your PCP may decide not to get them or add new tests based on their clinical decision)  On your next visit with your primary care physician please Get Medicines reviewed and adjusted.  If you experience worsening of your admission  symptoms, develop shortness of breath, life threatening emergency, suicidal or homicidal thoughts you must seek medical attention immediately by calling 911 or calling your MD immediately  if symptoms less severe.  You Must read complete instructions/literature along with all the possible adverse reactions/side effects for all the Medicines you take and that have been prescribed to you. Take any new Medicines after you have completely understood and accpet all the possible adverse reactions/side effects.   Do not drive, operate heavy machinery, perform activities at heights, swimming or participation in water activities or provide baby sitting  services if your were admitted for syncope or siezures until you have seen by Primary MD or a Neurologist and advised to do so again.  Do not drive when taking Pain medications.   Procedures/Studies: CT ABDOMEN PELVIS W CONTRAST Result Date: 11/12/2024 EXAM: CT ABDOMEN AND PELVIS WITH CONTRAST 11/12/2024 08:03:53 PM TECHNIQUE: CT of the abdomen and pelvis was performed with the administration of 100 mL of iohexol  (OMNIPAQUE ) 300 MG/ML solution. Multiplanar reformatted images are provided for review. Automated exposure control, iterative reconstruction, and/or weight-based adjustment of the mA/kV was utilized to reduce the radiation dose to as low as reasonably achievable. COMPARISON: Comparison is made to 06/14/2024. CLINICAL HISTORY: Epigastric pain, vomiting, tachypnea, and nausea. FINDINGS: LOWER CHEST: Emphysema noted within the visualized lung bases. LIVER: The liver is unremarkable. GALLBLADDER AND BILE DUCTS: Status post cholecystectomy. Mild dilation of the biliary tree likely reflects post cholecystectomy change. SPLEEN: No acute abnormality. PANCREAS: No acute abnormality. ADRENAL GLANDS: No acute abnormality. KIDNEYS, URETERS AND BLADDER: No stones in the kidneys or ureters. No hydronephrosis. No perinephric or periureteral stranding. Circumaortic left renal vein. Urinary bladder is unremarkable. GI AND BOWEL: Stomach demonstrates no acute abnormality. There is no bowel obstruction. PERITONEUM AND RETROPERITONEUM: No ascites. No free air. VASCULATURE: Aorta is normal in caliber. Mild aortoiliac atherosclerotic calcification. No aortic aneurysm. LYMPH NODES: No lymphadenopathy. REPRODUCTIVE ORGANS: Uterus absent. No adnexal masses. BONES AND SOFT TISSUES: No acute osseous abnormality. No focal soft tissue abnormality. IMPRESSION: 1. No acute findings in the abdomen or pelvis. 2. Pulmonary emphysema. Pulmonary emphysema is an independent risk factor for lung cancer. Recommend consideration for  evaluation for a low-dose CT lung cancer screening program. 3. Status post cholecystectomy with mild biliary tree dilation, likely postcholecystectomy change. Electronically signed by: Dorethia Molt MD 11/12/2024 10:43 PM EST RP Workstation: [id number]     The results of significant diagnostics from this hospitalization (including imaging, microbiology, ancillary and laboratory) are listed below for reference.     Microbiology: No results found for this or any previous visit (from the past 240 hours).   Labs: BNP (last 3 results) No results for input(s): BNP in the last 8760 hours. Basic Metabolic Panel: Recent Labs  Lab 11/12/24 1451 11/13/24 0314  NA 140 140  K 3.7 3.7  CL 103 105  CO2 18* 26  GLUCOSE 129* 99  BUN 11 9  CREATININE 0.63 0.59  CALCIUM 10.0 8.3*  MG  --  2.1  PHOS  --  3.2   Liver Function Tests: Recent Labs  Lab 11/12/24 1451 11/13/24 0314  AST 24 21  ALT 21 18  ALKPHOS 66 58  BILITOT 0.3 0.4  PROT 7.0 6.2*  ALBUMIN 4.6 4.2   Recent Labs  Lab 11/12/24 1451  LIPASE 17   No results for input(s): AMMONIA in the last 168 hours. CBC: Recent Labs  Lab 11/12/24 1451 11/13/24 0314  WBC 9.5 10.4  HGB 14.8 13.9  HCT 44.6 41.0  MCV 93.5 92.1  PLT 184 144*   Cardiac Enzymes: No results for input(s): CKTOTAL, CKMB, CKMBINDEX, TROPONINI in the last 168 hours. BNP: Invalid input(s): POCBNP CBG: No results for input(s): GLUCAP in the last 168 hours. D-Dimer No results for input(s): DDIMER in the last 72 hours. Hgb A1c No results for input(s): HGBA1C in the last 72 hours. Lipid Profile No results for input(s): CHOL, HDL, LDLCALC, TRIG, CHOLHDL, LDLDIRECT in the last 72 hours. Thyroid function studies No results for input(s): TSH, T4TOTAL, T3FREE, THYROIDAB in the last 72 hours.  Invalid input(s): FREET3 Anemia work up No results for input(s): VITAMINB12, FOLATE, FERRITIN, TIBC, IRON,  RETICCTPCT in the last 72 hours. Urinalysis    Component Value Date/Time   COLORURINE YELLOW 11/12/2024 1926   APPEARANCEUR CLEAR 11/12/2024 1926   APPEARANCEUR Clear 09/24/2024 1405   LABSPEC 1.012 11/12/2024 1926   PHURINE 7.0 11/12/2024 1926   GLUCOSEU NEGATIVE 11/12/2024 1926   HGBUR NEGATIVE 11/12/2024 1926   BILIRUBINUR NEGATIVE 11/12/2024 1926   BILIRUBINUR Negative 09/24/2024 1405   KETONESUR 20 (A) 11/12/2024 1926   PROTEINUR NEGATIVE 11/12/2024 1926   UROBILINOGEN 0.2 10/31/2012 1120   NITRITE NEGATIVE 11/12/2024 1926   LEUKOCYTESUR NEGATIVE 11/12/2024 1926   Sepsis Labs Recent Labs  Lab 11/12/24 1451 11/13/24 0314  WBC 9.5 10.4   Microbiology No results found for this or any previous visit (from the past 240 hours).   Time coordinating discharge:  I have spent 35 minutes face to face with the patient and on the ward discussing the patients care, assessment, plan and disposition with other care givers. >50% of the time was devoted counseling the patient about the risks and benefits of treatment/Discharge disposition and coordinating care.   SIGNED:   Burgess JAYSON Dare, MD  [facility] 11/13/2024, 11:20 AM   If 7PM-7AM, please contact night-coverage      [1]  Allergies Allergen Reactions   Elemental Sulfur Swelling   Penicillins Hives

## 2024-11-13 NOTE — ED Notes (Signed)
 Pt will like to leave AMA and stated  She feels fine and will like to go home. Dr, Made aware of pts wishes.

## 2024-11-14 ENCOUNTER — Emergency Department (HOSPITAL_COMMUNITY)
Admission: EM | Admit: 2024-11-14 | Discharge: 2024-11-14 | Disposition: A | Discharge status: Home / Self Care | Source: Home / Self Care | Attending: Emergency Medicine | Admitting: Emergency Medicine

## 2024-11-14 ENCOUNTER — Emergency Department (HOSPITAL_COMMUNITY)
Admission: EM | Admit: 2024-11-14 | Discharge: 2024-11-15 | Disposition: A | Discharge status: Home / Self Care | Source: Home / Self Care | Attending: Emergency Medicine | Admitting: Emergency Medicine

## 2024-11-14 ENCOUNTER — Other Ambulatory Visit: Payer: Self-pay

## 2024-11-14 DIAGNOSIS — R1110 Vomiting, unspecified: Secondary | ICD-10-CM | POA: Diagnosis not present

## 2024-11-14 DIAGNOSIS — R112 Nausea with vomiting, unspecified: Secondary | ICD-10-CM | POA: Diagnosis present

## 2024-11-14 DIAGNOSIS — R10819 Abdominal tenderness, unspecified site: Secondary | ICD-10-CM | POA: Diagnosis not present

## 2024-11-14 DIAGNOSIS — R1084 Generalized abdominal pain: Secondary | ICD-10-CM | POA: Insufficient documentation

## 2024-11-14 DIAGNOSIS — K5900 Constipation, unspecified: Secondary | ICD-10-CM | POA: Diagnosis not present

## 2024-11-14 DIAGNOSIS — J439 Emphysema, unspecified: Secondary | ICD-10-CM | POA: Insufficient documentation

## 2024-11-14 DIAGNOSIS — R109 Unspecified abdominal pain: Secondary | ICD-10-CM | POA: Diagnosis present

## 2024-11-14 LAB — CBC WITH DIFFERENTIAL/PLATELET
Abs Immature Granulocytes: 0.03 K/uL (ref 0.00–0.07)
Abs Immature Granulocytes: 0.03 K/uL (ref 0.00–0.07)
Basophils Absolute: 0 K/uL (ref 0.0–0.1)
Basophils Absolute: 0 K/uL (ref 0.0–0.1)
Basophils Relative: 0 %
Basophils Relative: 0 %
Eosinophils Absolute: 0 K/uL (ref 0.0–0.5)
Eosinophils Absolute: 0 K/uL (ref 0.0–0.5)
Eosinophils Relative: 0 %
Eosinophils Relative: 0 %
HCT: 43.9 % (ref 36.0–46.0)
HCT: 44.8 % (ref 36.0–46.0)
Hemoglobin: 15.1 g/dL — ABNORMAL HIGH (ref 12.0–15.0)
Hemoglobin: 15.1 g/dL — ABNORMAL HIGH (ref 12.0–15.0)
Immature Granulocytes: 0 %
Immature Granulocytes: 1 %
Lymphocytes Relative: 16 %
Lymphocytes Relative: 20 %
Lymphs Abs: 1.3 K/uL (ref 0.7–4.0)
Lymphs Abs: 1.8 K/uL (ref 0.7–4.0)
MCH: 30.8 pg (ref 26.0–34.0)
MCH: 30.9 pg (ref 26.0–34.0)
MCHC: 33.7 g/dL (ref 30.0–36.0)
MCHC: 34.4 g/dL (ref 30.0–36.0)
MCV: 89.8 fL (ref 80.0–100.0)
MCV: 91.4 fL (ref 80.0–100.0)
Monocytes Absolute: 0.4 K/uL (ref 0.1–1.0)
Monocytes Absolute: 0.5 K/uL (ref 0.1–1.0)
Monocytes Relative: 4 %
Monocytes Relative: 6 %
Neutro Abs: 4.7 K/uL (ref 1.7–7.7)
Neutro Abs: 8.9 K/uL — ABNORMAL HIGH (ref 1.7–7.7)
Neutrophils Relative %: 73 %
Neutrophils Relative %: 80 %
Platelets: 143 K/uL — ABNORMAL LOW (ref 150–400)
Platelets: 153 K/uL (ref 150–400)
RBC: 4.89 MIL/uL (ref 3.87–5.11)
RBC: 4.9 MIL/uL (ref 3.87–5.11)
RDW: 13.4 % (ref 11.5–15.5)
RDW: 13.4 % (ref 11.5–15.5)
WBC: 11.2 K/uL — ABNORMAL HIGH (ref 4.0–10.5)
WBC: 6.4 K/uL (ref 4.0–10.5)
nRBC: 0 % (ref 0.0–0.2)
nRBC: 0 % (ref 0.0–0.2)

## 2024-11-14 LAB — LIPASE, BLOOD: Lipase: 24 U/L (ref 11–51)

## 2024-11-14 LAB — URINALYSIS, ROUTINE W REFLEX MICROSCOPIC
Bilirubin Urine: NEGATIVE
Glucose, UA: NEGATIVE mg/dL
Ketones, ur: 20 mg/dL — AB
Leukocytes,Ua: NEGATIVE
Nitrite: NEGATIVE
Protein, ur: NEGATIVE mg/dL
Specific Gravity, Urine: 1.012 (ref 1.005–1.030)
pH: 6 (ref 5.0–8.0)

## 2024-11-14 LAB — COMPREHENSIVE METABOLIC PANEL WITH GFR
ALT: 19 U/L (ref 0–44)
AST: 24 U/L (ref 15–41)
Albumin: 4.7 g/dL (ref 3.5–5.0)
Alkaline Phosphatase: 62 U/L (ref 38–126)
Anion gap: 11 (ref 5–15)
BUN: 8 mg/dL (ref 8–23)
CO2: 28 mmol/L (ref 22–32)
Calcium: 8.7 mg/dL — ABNORMAL LOW (ref 8.9–10.3)
Chloride: 100 mmol/L (ref 98–111)
Creatinine, Ser: 0.59 mg/dL (ref 0.44–1.00)
GFR, Estimated: >60 mL/min
Glucose, Bld: 99 mg/dL (ref 70–99)
Potassium: 3.5 mmol/L (ref 3.5–5.1)
Sodium: 139 mmol/L (ref 135–145)
Total Bilirubin: 0.4 mg/dL (ref 0.0–1.2)
Total Protein: 7.1 g/dL (ref 6.5–8.1)

## 2024-11-14 LAB — URINE DRUG SCREEN
Amphetamines: NEGATIVE
Barbiturates: NEGATIVE
Benzodiazepines: NEGATIVE
Cocaine: NEGATIVE
Fentanyl: NEGATIVE
Methadone Scn, Ur: NEGATIVE
Opiates: NEGATIVE
Tetrahydrocannabinol: POSITIVE — AB

## 2024-11-14 MED ORDER — PANTOPRAZOLE SODIUM 40 MG IV SOLR
40.0000 mg | Freq: Once | INTRAVENOUS | Status: AC
  Administered 2024-11-14: 40 mg via INTRAVENOUS
  Filled 2024-11-14: qty 10

## 2024-11-14 MED ORDER — ONDANSETRON HCL 4 MG/2ML IJ SOLN: 4.0000 mg | Freq: Once | INTRAMUSCULAR | Status: DC | PRN

## 2024-11-14 MED ORDER — ALUM & MAG HYDROXIDE-SIMETH 200-200-20 MG/5ML PO SUSP
30.0000 mL | Freq: Once | ORAL | Status: AC
  Administered 2024-11-14: 30 mL via ORAL
  Filled 2024-11-14: qty 30

## 2024-11-14 MED ORDER — PANTOPRAZOLE SODIUM 20 MG PO TBEC: 20.0000 mg | DELAYED_RELEASE_TABLET | Freq: Every day | ORAL | 0 refills | Status: AC

## 2024-11-14 MED ORDER — HALOPERIDOL LACTATE 5 MG/ML IJ SOLN
2.0000 mg | Freq: Once | INTRAMUSCULAR | Status: AC
  Administered 2024-11-15: 2 mg via INTRAVENOUS
  Filled 2024-11-14: qty 1

## 2024-11-14 MED ORDER — SODIUM CHLORIDE 0.9 % IV BOLUS
1000.0000 mL | Freq: Once | INTRAVENOUS | Status: AC
  Administered 2024-11-14: 1000 mL via INTRAVENOUS

## 2024-11-14 MED ORDER — HYDROMORPHONE HCL 1 MG/ML IJ SOLN
0.5000 mg | Freq: Once | INTRAMUSCULAR | Status: AC
  Administered 2024-11-14: 0.5 mg via INTRAVENOUS
  Filled 2024-11-14: qty 0.5

## 2024-11-14 MED ORDER — LIDOCAINE VISCOUS HCL 2 % MT SOLN
15.0000 mL | Freq: Once | OROMUCOSAL | Status: AC
  Administered 2024-11-14: 15 mL via ORAL
  Filled 2024-11-14: qty 15

## 2024-11-14 MED ORDER — METOCLOPRAMIDE HCL 10 MG PO TABS: 10.0000 mg | ORAL_TABLET | Freq: Four times a day (QID) | ORAL | 0 refills | Status: AC

## 2024-11-14 MED ORDER — DROPERIDOL 2.5 MG/ML IJ SOLN
1.2500 mg | Freq: Once | INTRAMUSCULAR | Status: AC
  Administered 2024-11-14: 1.25 mg via INTRAVENOUS
  Filled 2024-11-14: qty 2

## 2024-11-14 MED ORDER — DIPHENHYDRAMINE HCL 50 MG/ML IJ SOLN
12.5000 mg | Freq: Once | INTRAMUSCULAR | Status: AC
  Administered 2024-11-14: 12.5 mg via INTRAVENOUS
  Filled 2024-11-14: qty 1

## 2024-11-14 NOTE — ED Notes (Signed)
 Pt given peanut butter cracker and water for a PO challenge.

## 2024-11-14 NOTE — Discharge Instructions (Signed)
 You were seen for your nausea and vomiting in the emergency department.  At home, please take the Reglan  for your nausea and vomiting. Please be sure to stay well-hydrated. Refrain from [facility] related products  Follow-up with your primary doctor in 2-3 days regarding your visit. Follow-up with your GI doctor  Return immediately to the emergency department if you experience any of the following: fainting, abdominal pain, high fevers, or any other concerning symptoms.  Thank you for visiting our Emergency Department. It was a pleasure taking care of you today.

## 2024-11-14 NOTE — ED Notes (Signed)
 ED Provider at bedside.

## 2024-11-14 NOTE — ED Triage Notes (Signed)
 Patient come in POV for complaint of abdominal pain, states continues to have pain that feels like battery acid in he throat, vomiting and has feeling of urgency for bowel but not producing anything. States she come in but left AMA.

## 2024-11-14 NOTE — ED Provider Notes (Signed)
 " [place] [facility] AT [facility] Provider Note   CSN: [id number] Arrival date & time: 11/14/24  2220     Patient presents with: Abdominal Pain, Constipation, and Emesis   Carolyn Boyd is a 63 y.o. female.  {Add pertinent medical, surgical, social history, OB history to YEP:67052} The history is provided by the patient.  Patient with history of migraines, IBS, kidney stones, marijuana use presents with ongoing abdominal pain and vomiting and constipation  patient has had the symptoms for several days She had a recent ER visit and had extensive workup and was discharged home but her pain persisted.  She has had nonbloody emesis.  She has not had a bowel movement in about 4 days.  Previous surgery includes cholecystectomy and hysterectomy She reports she has stopped smoking marijuana and using Gummies only Denies any alcohol use   Past Medical History:  Diagnosis Date   IBS (irritable bowel syndrome)    Kidney stones    Kidney stones    Migraine     Prior to Admission medications  Medication Sig Start Date End Date Taking? Authorizing Provider  ALPRAZolam (XANAX) 0.5 MG tablet Take 0.5 mg by mouth daily as needed. 10/17/24   [provider]  Biotin 1000 MCG tablet Take by mouth.    [provider]  escitalopram (LEXAPRO) 5 MG tablet Take 5 mg by mouth daily. 02/15/24   [provider]  estradiol (ESTRACE) 0.5 MG tablet Take 0.5 mg by mouth daily. 10/29/24   [provider]  fluticasone (FLONASE) 50 MCG/ACT nasal spray Place 1 spray into both nostrils daily. 06/18/16   [provider]  HYDROcodone -acetaminophen  (NORCO/VICODIN) 5-325 MG tablet Take 1 tablet by mouth daily as needed. 09/26/20   [provider]  metoCLOPramide  (REGLAN ) 10 MG tablet Take 1 tablet (10 mg total) by mouth every 6 (six) hours. 11/14/24   Yolande Lamar BROCKS, MD  Multiple Vitamin (MULITIVITAMIN WITH MINERALS) TABS Take 1 tablet by  mouth daily.    [provider]  pantoprazole  (PROTONIX ) 20 MG tablet Take 1 tablet (20 mg total) by mouth daily. 11/14/24   Yolande Lamar BROCKS, MD  promethazine  (PHENERGAN ) 25 MG tablet Take 1 tablet (25 mg total) by mouth every 6 (six) hours as needed for nausea. 10/31/12   Bluford Rogue, MD  traZODone (DESYREL) 50 MG tablet  08/26/20   [provider]  dicyclomine (BENTYL) 10 MG capsule Take 10 mg by mouth 4 (four) times daily -  before meals and at bedtime.    01/31/12  [provider]  esomeprazole (NEXIUM) 40 MG capsule Take 40 mg by mouth daily before breakfast.    01/31/12  [provider]    Allergies: Elemental sulfur and Penicillins    Review of Systems  Constitutional:  Negative for fever.  Cardiovascular:  Negative for chest pain.  Gastrointestinal:  Positive for abdominal pain, constipation, nausea and vomiting. Negative for blood in stool and diarrhea.  Genitourinary:  Negative for dysuria.    Updated Vital Signs BP (!) 169/152   Pulse (!) 54   Resp 18   Ht 1.702 m (5' 7)   Wt 41.7 kg   SpO2 97%   BMI 14.41 kg/m   Physical Exam CONSTITUTIONAL: Disheveled, anxious and tearful HEAD: Normocephalic/atraumatic EYES: EOMI/PERRL ENMT: Mucous membranes moist NECK: supple no meningeal signs CV: S1/S2 noted, no murmurs/rubs/gallops noted LUNGS: Lungs are clear to auscultation bilaterally, no apparent distress ABDOMEN: soft, nontender, no rebound or guarding,  bowel sounds noted throughout abdomen GU:no cva tenderness NEURO: Pt is awake/alert/appropriate, moves all extremitiesx4.  No facial droop.   EXTREMITIES: pulses normal/equal, full ROM SKIN: warm, color normal PSYCH: Tearful and anxious  (all labs ordered are listed, but only abnormal results are displayed) Labs Reviewed  BASIC METABOLIC PANEL WITH GFR  CBC WITH DIFFERENTIAL/PLATELET  LACTIC ACID, PLASMA  LACTIC ACID, PLASMA    EKG: None  Radiology: No results  found.  {Document cardiac monitor, telemetry assessment procedure when appropriate:32947} Procedures   Medications Ordered in the ED  sodium chloride  0.9 % bolus 1,000 mL (has no administration in time range)      {Click here for ABCD2, HEART and other calculators REFRESH Note before signing:1}                              Medical Decision Making Amount and/or Complexity of Data Reviewed Labs: ordered. ECG/medicine tests: ordered.   This patient presents to the ED for concern of abdominal pain and vomiting, this involves an extensive number of treatment options, and is a complaint that carries with it a high risk of complications and morbidity.  The differential diagnosis includes but is not limited to  pancreatitis, gastritis, peptic ulcer disease, appendicitis, bowel obstruction, bowel perforation,  ischemic bowel, cannabis hyperemesis   Comorbidities that complicate the patient evaluation: Patients presentation is complicated by their history of IBS  Social Determinants of Health: Patients marijuana use  increases the complexity of managing their presentation  Additional history obtained: Records reviewed previous admission documents  Lab Tests: I Ordered, and personally interpreted labs.  The pertinent results include:  ***  Medicines ordered and prescription drug management: I ordered medication including ***  for ***  Reevaluation of the patient after these medicines showed that the patient    {resolved/improved/worsened:23923::improved}  Test Considered: Patient is low risk / negative by ***, therefore do not feel that *** is indicated.  Critical Interventions:  ***  Consultations Obtained: I requested consultation with the {consultation:26851}, and discussed  findings as well as pertinent plan - they recommend: ***  Reevaluation: After the interventions noted above, I reevaluated the patient and found that they have  :{resolved/improved/worsened:23923::improved}  Complexity of problems addressed: Patients presentation is most consistent with  exacerbation of chronic illness  Disposition: After consideration of the diagnostic results and the patients response to treatment,  I feel that the patent would benefit from {disposition:26850}.     {Document critical care time when appropriate  Document review of labs and clinical decision tools ie CHADS2VASC2, etc  Document your independent review of radiology images and any outside records  Document your discussion with family members, caretakers and with consultants  Document social determinants of health affecting pt's care  Document your decision making why or why not admission, treatments were needed:32947:::1}   Final diagnoses:  Lung disease, emphysema (HCC)  Generalized abdominal pain    ED Discharge Orders     None        "

## 2024-11-14 NOTE — ED Triage Notes (Signed)
 Pt bib EMS from home for 10/10 abdominal pain, emesis, and constipation. Per EMS pain started around 5p today, seen for same yesterday. No BM for 1 week. Ems gave 4mg  Zofran , VSS, 20g Left AC. Abdominal Pain present in Lower abdomin and described as burning sensation.

## 2024-11-14 NOTE — ED Provider Notes (Signed)
 " [place] EMERGENCY DEPARTMENT AT [facility] Provider Note   CSN: [id number] Arrival date & time: 11/14/24  9375     Patient presents with: Abdominal Pain and Emesis   Carolyn Boyd is a 63 y.o. female.   63 year old female history of hysterectomy and cholecystectomy presents to the emergency department with abdominal pain and nausea and vomiting.  Has been going on for 5 days.  Innumerable episodes of nonbloody nonbilious emesis.  Says that she feels a burning sensation from her esophagus to her intestines especially worse when trying to swallow.  Day since her last bowel movement but is still passing gas.  Was seen here on 12/29 and had a CT of the abdomen pelvis without acute findings.  Went home with Zofran  but reports that it is not working anymore.  Has not used THC in approximately 6 months.  No heavy alcohol use or other recreational drug use per.        Prior to Admission medications  Medication Sig Start Date End Date Taking? Authorizing Provider  ALPRAZolam (XANAX) 0.5 MG tablet Take 0.5 mg by mouth daily as needed. 10/17/24  Yes [provider]  Biotin 1000 MCG tablet Take by mouth.   Yes [provider]  escitalopram (LEXAPRO) 5 MG tablet Take 5 mg by mouth daily. 02/15/24  Yes [provider]  estradiol (ESTRACE) 0.5 MG tablet Take 0.5 mg by mouth daily. 10/29/24  Yes [provider]  fluticasone (FLONASE) 50 MCG/ACT nasal spray Place 1 spray into both nostrils daily. 06/18/16  Yes [provider]  HYDROcodone -acetaminophen  (NORCO/VICODIN) 5-325 MG tablet Take 1 tablet by mouth daily as needed. 09/26/20  Yes [provider]  metoCLOPramide  (REGLAN ) 10 MG tablet Take 1 tablet (10 mg total) by mouth every 6 (six) hours. 11/14/24  Yes Yolande Lamar BROCKS, MD  Multiple Vitamin (MULITIVITAMIN WITH MINERALS) TABS Take 1 tablet by mouth daily.   Yes [provider]  pantoprazole  (PROTONIX ) 20 MG tablet Take 1  tablet (20 mg total) by mouth daily. 11/14/24  Yes Yolande Lamar BROCKS, MD  promethazine  (PHENERGAN ) 25 MG tablet Take 1 tablet (25 mg total) by mouth every 6 (six) hours as needed for nausea. 10/31/12  Yes Bluford Rogue, MD  traZODone (DESYREL) 50 MG tablet  08/26/20  Yes [provider]  dicyclomine (BENTYL) 10 MG capsule Take 10 mg by mouth 4 (four) times daily -  before meals and at bedtime.    01/31/12  [provider]  esomeprazole (NEXIUM) 40 MG capsule Take 40 mg by mouth daily before breakfast.    01/31/12  [provider]    Allergies: Elemental sulfur and Penicillins    Review of Systems  Updated Vital Signs BP 121/70   Pulse (!) 44   Temp 98.5 F (36.9 C) (Oral)   Resp 18   Ht 5' 7 (1.702 m)   Wt 41.7 kg   SpO2 98%   BMI 14.41 kg/m   Physical Exam Vitals and nursing note reviewed.  Constitutional:      General: She is not in acute distress.    Appearance: She is well-developed.  HENT:     Head: Normocephalic and atraumatic.     Right Ear: External ear normal.     Left Ear: External ear normal.     Nose: Nose normal.  Eyes:     Extraocular Movements: Extraocular movements intact.     Conjunctiva/sclera: Conjunctivae normal.     Pupils: Pupils are  equal, round, and reactive to light.  Abdominal:     General: Abdomen is flat. There is no distension.     Palpations: Abdomen is soft. There is no mass.     Tenderness: There is abdominal tenderness (Minimal, diffuse). There is no guarding.  Musculoskeletal:     Cervical back: Normal range of motion and neck supple.     Right lower leg: No edema.     Left lower leg: No edema.  Skin:    General: Skin is warm and dry.  Neurological:     Mental Status: She is alert and oriented to person, place, and time. Mental status is at baseline.  Psychiatric:        Mood and Affect: Mood normal.     (all labs ordered are listed, but only abnormal results are displayed) Labs Reviewed  COMPREHENSIVE  METABOLIC PANEL WITH GFR - Abnormal; Notable for the following components:      Result Value   Calcium 8.7 (*)    All other components within normal limits  CBC WITH DIFFERENTIAL/PLATELET - Abnormal; Notable for the following components:   Hemoglobin 15.1 (*)    Platelets 143 (*)    All other components within normal limits  URINALYSIS, ROUTINE W REFLEX MICROSCOPIC - Abnormal; Notable for the following components:   Hgb urine dipstick SMALL (*)    Ketones, ur 20 (*)    Bacteria, UA RARE (*)    All other components within normal limits  URINE DRUG SCREEN - Abnormal; Notable for the following components:   Tetrahydrocannabinol POSITIVE (*)    All other components within normal limits  LIPASE, BLOOD    EKG: EKG Interpretation Date/Time:  Wednesday November 14 2024 08:12:48 EST Ventricular Rate:  50 PR Interval:  158 QRS Duration:  88 QT Interval:  491 QTC Calculation: 448 R Axis:   50  Text Interpretation: Sinus rhythm Biatrial enlargement Probable left ventricular hypertrophy Nonspecific ST abnormality Baseline wander in lead(s) II Confirmed by Yolande Charleston [phone]) on 11/14/2024 8:24:27 AM  Radiology: No results found.    Procedures   Medications Ordered in the ED  pantoprazole  (PROTONIX ) injection 40 mg (40 mg Intravenous Given 11/14/24 0834)  droperidol (INAPSINE) 2.5 MG/ML injection 1.25 mg (1.25 mg Intravenous Given 11/14/24 0833)  diphenhydrAMINE  (BENADRYL ) injection 12.5 mg (12.5 mg Intravenous Given 11/14/24 0832)  HYDROmorphone  (DILAUDID ) injection 0.5 mg (0.5 mg Intravenous Given 11/14/24 0835)  alum & mag hydroxide-simeth (MAALOX/MYLANTA) 200-200-20 MG/5ML suspension 30 mL (30 mLs Oral Given 11/14/24 0937)    And  lidocaine  (XYLOCAINE ) 2 % viscous mouth solution 15 mL (15 mLs Oral Given 11/14/24 9062)                                    Medical Decision Making Amount and/or Complexity of Data Reviewed Labs: ordered.  Risk OTC drugs. Prescription  drug management.   Carolyn Boyd is a 63 year old female history of hysterectomy and cholecystectomy presents emergency department with abdominal pain nausea and vomiting  Initial Ddx:  Gastroenteritis, cannabinoid hyperemesis, SBO, ileus, appendicitis, gastritis  MDM/Course:  Patient presents emergency department with nausea and vomiting.  Also has developed some burning sensation in her stomach and throat which I suspect from gastritis and esophagitis due to her vomiting.  CT scan 3 days ago without acute findings.  She is not in acute distress on exam.  Minimal diffuse abdominal tenderness to palpation but no  focal tenderness of her right lower right upper quadrant.  CBC without leukocytosis.  Hemoglobin slightly elevated at 15 which I suspect is hemoconcentration from her nausea and vomiting.  Chemistry unremarkable.  Urine drug screen is positive for [facility] though she initially told me that she used it last summer.  I did ask her about this and she said that she still uses THC Gummies occasionally.  Counseled that this could be related to her symptoms and that would be better for her to abstain from this.  With recent CT scan and reassuring abdominal exam and labs do not feel that repeat is warranted at this point in time.  Upon re-evaluation symptoms resolved after Dilaudid , droperidol, Protonix , and GI cocktail.  She is tolerating p.o.  Discharged home with prescription for Reglan  and Protonix   This patient presents to the ED for concern of complaints listed in HPI, this involves an extensive number of treatment options, and is a complaint that carries with it a high risk of complications and morbidity. Disposition including potential need for admission considered.   Dispo: DC Home. Return precautions discussed including, but not limited to, those listed in the AVS. Allowed pt time to ask questions which were answered fully prior to dc.  I have reviewed the patients home medications and made  adjustments as needed Records reviewed Outpatient Clinic Notes The following labs were independently interpreted: Chemistry and show no acute abnormality I personally reviewed and interpreted cardiac monitoring: normal sinus rhythm  I personally reviewed and interpreted the pt's EKG: see above for interpretation   Portions of this note were generated with Dragon dictation software. Dictation errors may occur despite best attempts at proofreading.     Final diagnoses:  Nausea and vomiting, unspecified vomiting type    ED Discharge Orders          Ordered    metoCLOPramide  (REGLAN ) 10 MG tablet  Every 6 hours        11/14/24 1026    pantoprazole  (PROTONIX ) 20 MG tablet  Daily        11/14/24 1026               Yolande Lamar BROCKS, MD 11/14/24 2024  "

## 2024-11-15 LAB — BASIC METABOLIC PANEL WITH GFR
Anion gap: 10 (ref 5–15)
BUN: 10 mg/dL (ref 8–23)
CO2: 28 mmol/L (ref 22–32)
Calcium: 9.1 mg/dL (ref 8.9–10.3)
Chloride: 100 mmol/L (ref 98–111)
Creatinine, Ser: 0.63 mg/dL (ref 0.44–1.00)
GFR, Estimated: >60 mL/min
Glucose, Bld: 107 mg/dL — ABNORMAL HIGH (ref 70–99)
Potassium: 3.2 mmol/L — ABNORMAL LOW (ref 3.5–5.1)
Sodium: 138 mmol/L (ref 135–145)

## 2024-11-15 LAB — TROPONIN T, HIGH SENSITIVITY: Troponin T High Sensitivity: <15 ng/L (ref 0–19)

## 2024-11-15 LAB — MAGNESIUM: Magnesium: 2.2 mg/dL (ref 1.7–2.4)

## 2024-11-15 LAB — LACTIC ACID, PLASMA: Lactic Acid, Venous: 0.9 mmol/L (ref 0.5–1.9)

## 2024-11-15 MED ORDER — KETOROLAC TROMETHAMINE 15 MG/ML IJ SOLN
15.0000 mg | Freq: Once | INTRAMUSCULAR | Status: AC
  Administered 2024-11-15: 15 mg via INTRAVENOUS
  Filled 2024-11-15: qty 1

## 2024-11-15 MED ORDER — HALOPERIDOL LACTATE 5 MG/ML IJ SOLN
2.0000 mg | Freq: Once | INTRAMUSCULAR | Status: AC
  Administered 2024-11-15: 2 mg via INTRAVENOUS
  Filled 2024-11-15: qty 1

## 2024-11-15 NOTE — ED Notes (Signed)
 Pt given water for PO challenge and was successful.  MD notified.

## 2024-11-15 NOTE — Discharge Instructions (Addendum)
°  SEEK IMMEDIATE MEDICAL ATTENTION IF: °A temperature above 100.4F develops.  °Repeated vomiting occurs (multiple episodes).  °The pain becomes localized to portions of the abdomen. The right side could possibly be appendicitis. In an adult, the left lower portion of the abdomen could be colitis or diverticulitis.  °Blood is being passed in stools or vomit (bright red or black tarry stools).  °Return also if you develop chest pain, difficulty breathing, dizziness or fainting, or become confused, poorly responsive, or inconsolable. ° °

## 2024-11-15 NOTE — ED Notes (Signed)
 Pt calling out for pain.  ED notified.

## 2025-01-01 ENCOUNTER — Ambulatory Visit (INDEPENDENT_AMBULATORY_CARE_PROVIDER_SITE_OTHER): Admitting: Gastroenterology

## 2025-03-27 ENCOUNTER — Ambulatory Visit: Admitting: Urology
